# Patient Record
Sex: Female | Born: 1985
Health system: Southern US, Community
[De-identification: ages and names within clinical notes are randomized; demographics above are authoritative.]

## PROBLEM LIST (undated history)

## (undated) DIAGNOSIS — F319 Bipolar disorder, unspecified: Secondary | ICD-10-CM

## (undated) DIAGNOSIS — K219 Gastro-esophageal reflux disease without esophagitis: Secondary | ICD-10-CM

## (undated) DIAGNOSIS — F419 Anxiety disorder, unspecified: Secondary | ICD-10-CM

## (undated) DIAGNOSIS — Z8619 Personal history of other infectious and parasitic diseases: Secondary | ICD-10-CM

## (undated) HISTORY — DX: Personal history of other infectious and parasitic diseases: Z86.19

## (undated) HISTORY — DX: Bipolar disorder, unspecified: F31.9

## (undated) HISTORY — PX: TONSILLECTOMY: SUR1361

## (undated) HISTORY — DX: Gastro-esophageal reflux disease without esophagitis: K21.9

---

## 2009-07-01 ENCOUNTER — Emergency Department (HOSPITAL_COMMUNITY): Admission: EM | Admit: 2009-07-01 | Discharge: 2009-07-01 | Payer: Self-pay | Admitting: Emergency Medicine

## 2010-11-14 LAB — URINE MICROSCOPIC-ADD ON

## 2010-11-14 LAB — POCT PREGNANCY, URINE: Preg Test, Ur: NEGATIVE

## 2010-11-14 LAB — GC/CHLAMYDIA PROBE AMP, GENITAL
Chlamydia, DNA Probe: NEGATIVE
GC Probe Amp, Genital: NEGATIVE

## 2010-11-14 LAB — URINALYSIS, ROUTINE W REFLEX MICROSCOPIC
Bilirubin Urine: NEGATIVE
Glucose, UA: NEGATIVE mg/dL
Hgb urine dipstick: NEGATIVE
Ketones, ur: NEGATIVE mg/dL
Nitrite: NEGATIVE
Specific Gravity, Urine: 1.027 (ref 1.005–1.030)
pH: 6 (ref 5.0–8.0)

## 2010-11-14 LAB — RPR: RPR Ser Ql: NONREACTIVE

## 2010-11-14 LAB — WET PREP, GENITAL: Yeast Wet Prep HPF POC: NONE SEEN

## 2011-10-11 ENCOUNTER — Emergency Department (HOSPITAL_COMMUNITY)
Admission: EM | Admit: 2011-10-11 | Discharge: 2011-10-11 | Disposition: A | Discharge status: Home / Self Care | Payer: Medicaid Other | Attending: Emergency Medicine | Admitting: Emergency Medicine

## 2011-10-11 ENCOUNTER — Encounter (HOSPITAL_COMMUNITY): Payer: Self-pay | Admitting: Emergency Medicine

## 2011-10-11 DIAGNOSIS — R5381 Other malaise: Secondary | ICD-10-CM | POA: Insufficient documentation

## 2011-10-11 DIAGNOSIS — R6883 Chills (without fever): Secondary | ICD-10-CM | POA: Insufficient documentation

## 2011-10-11 DIAGNOSIS — R109 Unspecified abdominal pain: Secondary | ICD-10-CM | POA: Insufficient documentation

## 2011-10-11 DIAGNOSIS — R112 Nausea with vomiting, unspecified: Secondary | ICD-10-CM | POA: Insufficient documentation

## 2011-10-11 DIAGNOSIS — IMO0001 Reserved for inherently not codable concepts without codable children: Secondary | ICD-10-CM | POA: Insufficient documentation

## 2011-10-11 DIAGNOSIS — R51 Headache: Secondary | ICD-10-CM | POA: Insufficient documentation

## 2011-10-11 DIAGNOSIS — R630 Anorexia: Secondary | ICD-10-CM | POA: Insufficient documentation

## 2011-10-11 DIAGNOSIS — R197 Diarrhea, unspecified: Secondary | ICD-10-CM | POA: Insufficient documentation

## 2011-10-11 LAB — DIFFERENTIAL
Eosinophils Absolute: 0 10*3/uL (ref 0.0–0.7)
Lymphocytes Relative: 4 % — ABNORMAL LOW (ref 12–46)
Lymphs Abs: 0.4 10*3/uL — ABNORMAL LOW (ref 0.7–4.0)
Monocytes Relative: 6 % (ref 3–12)
Neutrophils Relative %: 91 % — ABNORMAL HIGH (ref 43–77)

## 2011-10-11 LAB — CBC
Hemoglobin: 14.2 g/dL (ref 12.0–15.0)
MCH: 31.5 pg (ref 26.0–34.0)
MCV: 90.9 fL (ref 78.0–100.0)
RBC: 4.51 MIL/uL (ref 3.87–5.11)
WBC: 9.8 10*3/uL (ref 4.0–10.5)

## 2011-10-11 LAB — COMPREHENSIVE METABOLIC PANEL
ALT: 12 U/L (ref 0–35)
Alkaline Phosphatase: 59 U/L (ref 39–117)
BUN: 12 mg/dL (ref 6–23)
CO2: 21 mEq/L (ref 19–32)
GFR calc Af Amer: >90 mL/min (ref >90)
GFR calc non Af Amer: >90 mL/min (ref >90)
Glucose, Bld: 123 mg/dL — ABNORMAL HIGH (ref 70–99)
Potassium: 3.8 mEq/L (ref 3.5–5.1)
Sodium: 138 mEq/L (ref 135–145)
Total Bilirubin: 0.5 mg/dL (ref 0.3–1.2)
Total Protein: 8 g/dL (ref 6.0–8.3)

## 2011-10-11 LAB — URINE MICROSCOPIC-ADD ON

## 2011-10-11 LAB — URINALYSIS, ROUTINE W REFLEX MICROSCOPIC
Ketones, ur: 15 mg/dL — AB
Leukocytes, UA: NEGATIVE

## 2011-10-11 LAB — LIPASE, BLOOD: Lipase: 11 U/L (ref 11–59)

## 2011-10-11 MED ORDER — MORPHINE SULFATE 4 MG/ML IJ SOLN
2.0000 mg | Freq: Once | INTRAMUSCULAR | Status: AC
  Administered 2011-10-11: 2 mg via INTRAVENOUS
  Filled 2011-10-11: qty 1

## 2011-10-11 MED ORDER — ONDANSETRON HCL 4 MG/2ML IJ SOLN
4.0000 mg | Freq: Once | INTRAMUSCULAR | Status: AC
  Administered 2011-10-11: 4 mg via INTRAVENOUS
  Filled 2011-10-11: qty 2

## 2011-10-11 MED ORDER — PROMETHAZINE HCL 25 MG PO TABS: 25.0000 mg | ORAL_TABLET | Freq: Four times a day (QID) | ORAL | Status: DC | PRN

## 2011-10-11 MED ORDER — SODIUM CHLORIDE 0.9 % IV BOLUS (SEPSIS)
1000.0000 mL | Freq: Once | INTRAVENOUS | Status: AC
  Administered 2011-10-11: 1000 mL via INTRAVENOUS

## 2011-10-11 MED ORDER — PROMETHAZINE HCL 25 MG/ML IJ SOLN
12.5000 mg | Freq: Once | INTRAMUSCULAR | Status: AC
  Administered 2011-10-11: 12.5 mg via INTRAVENOUS
  Filled 2011-10-11: qty 1

## 2011-10-11 NOTE — ED Provider Notes (Signed)
History     CSN: [id number]  Arrival date & time 10/11/11  4098   First MD Initiated Contact with Patient 10/11/11 0935      Chief Complaint  Patient presents with  . Nausea  . Emesis  . Generalized Body Aches    (Consider location/radiation/quality/duration/timing/severity/associated sxs/prior treatment) Patient is a 26 y.o. female presenting with vomiting. The history is provided by the patient.  Emesis  This is a new problem. The current episode started yesterday. The problem occurs more than 10 times per day. The problem has not changed since onset.The emesis has an appearance of stomach contents. There has been no fever. Associated symptoms include abdominal pain, chills, diarrhea, headaches and myalgias. Pertinent negatives include no fever.  Pt states she began vomiting yesterday after eating [employer]. States nausea, vomiting, diarrhea since. Denies taking any medications. States trying to drink water and gatorade but nothing is staying down. Daughter just got over a "stomach virus." Denies fever, but states having chills and body aches.   History reviewed. No pertinent past medical history.  Past Surgical History  Procedure Date  . Tonsillectomy     History reviewed. No pertinent family history.  History  Substance Use Topics  . Smoking status: Never Smoker   . Smokeless tobacco: Not on file  . Alcohol Use: Yes     drinks liquor regularly    OB History    Grav Para Term Preterm Abortions TAB SAB Ect Mult Living                  Review of Systems  Constitutional: Positive for chills. Negative for fever.  Eyes: Negative.   Respiratory: Negative.   Cardiovascular: Negative.   Gastrointestinal: Positive for nausea, vomiting, abdominal pain and diarrhea. Negative for blood in stool.  Genitourinary: Negative.   Musculoskeletal: Positive for myalgias.  Skin: Negative.   Neurological: Positive for weakness and headaches. Negative for dizziness.    Psychiatric/Behavioral: Negative.     Allergies  Aleve; Amoxicillin; and Ibuprofen  Home Medications  No current outpatient prescriptions on file.  BP 118/73  Pulse 103  Temp(Src) 98 F (36.7 C) (Oral)  Resp 20  Ht 5\' 7"  (1.702 m)  Wt 161 lb (73.029 kg)  BMI 25.22 kg/m2  SpO2 97%  LMP 10/04/2011  Physical Exam  Nursing note and vitals reviewed. Constitutional: She is oriented to person, place, and time. She appears well-developed and well-nourished.  HENT:  Head: Normocephalic.  Eyes: Conjunctivae are normal.  Neck: Neck supple.  Cardiovascular: Normal rate, regular rhythm and normal heart sounds.   Pulmonary/Chest: Effort normal and breath sounds normal. No respiratory distress.  Abdominal: Soft. Bowel sounds are normal. She exhibits no distension.       Diffuse tenderness. No guarding, no rebound tenderness  Musculoskeletal: Normal range of motion.  Neurological: She is alert and oriented to person, place, and time.  Skin: Skin is warm and dry. No pallor.  Psychiatric: She has a normal mood and affect.    ED Course  Procedures (including critical care time)  9:45 AM Pt seen and examined. N/V/D since yesterday. Daughter just got over the same. Unable to keep anything down. Abdomen soft, diffusely tender. Labs ordered, fluids ordered, nausea and pain meds ordered.\]  Results for orders placed during the hospital encounter of 10/11/11  URINALYSIS, ROUTINE W REFLEX MICROSCOPIC      Component Value Range   Color, Urine AMBER (*) YELLOW    APPearance CLEAR  CLEAR  Specific Gravity, Urine 1.036 (*) 1.005 - 1.030    pH 6.0  5.0 - 8.0    Glucose, UA NEGATIVE  NEGATIVE (mg/dL)   Hgb urine dipstick NEGATIVE  NEGATIVE    Bilirubin Urine SMALL (*) NEGATIVE    Ketones, ur 15 (*) NEGATIVE (mg/dL)   Protein, ur 30 (*) NEGATIVE (mg/dL)   Urobilinogen, UA 0.2  0.0 - 1.0 (mg/dL)   Nitrite NEGATIVE  NEGATIVE    Leukocytes, UA NEGATIVE  NEGATIVE   PREGNANCY, URINE       Component Value Range   Preg Test, Ur NEGATIVE  NEGATIVE   CBC      Component Value Range   WBC 9.8  4.0 - 10.5 (K/uL)   RBC 4.51  3.87 - 5.11 (MIL/uL)   Hemoglobin 14.2  12.0 - 15.0 (g/dL)   HCT 16.1  09.6 - 04.5 (%)   MCV 90.9  78.0 - 100.0 (fL)   MCH 31.5  26.0 - 34.0 (pg)   MCHC 34.6  30.0 - 36.0 (g/dL)   RDW 40.9  81.1 - 91.4 (%)   Platelets 227  150 - 400 (K/uL)  DIFFERENTIAL      Component Value Range   Neutrophils Relative 91 (*) 43 - 77 (%)   Neutro Abs 8.8 (*) 1.7 - 7.7 (K/uL)   Lymphocytes Relative 4 (*) 12 - 46 (%)   Lymphs Abs 0.4 (*) 0.7 - 4.0 (K/uL)   Monocytes Relative 6  3 - 12 (%)   Monocytes Absolute 0.6  0.1 - 1.0 (K/uL)   Eosinophils Relative 0  0 - 5 (%)   Eosinophils Absolute 0.0  0.0 - 0.7 (K/uL)   Basophils Relative 0  0 - 1 (%)   Basophils Absolute 0.0  0.0 - 0.1 (K/uL)  COMPREHENSIVE METABOLIC PANEL      Component Value Range   Sodium 138  135 - 145 (mEq/L)   Potassium 3.8  3.5 - 5.1 (mEq/L)   Chloride 104  96 - 112 (mEq/L)   CO2 21  19 - 32 (mEq/L)   Glucose, Bld 123 (*) 70 - 99 (mg/dL)   BUN 12  6 - 23 (mg/dL)   Creatinine, Ser 7.82  0.50 - 1.10 (mg/dL)   Calcium 9.5  8.4 - 95.6 (mg/dL)   Total Protein 8.0  6.0 - 8.3 (g/dL)   Albumin 3.9  3.5 - 5.2 (g/dL)   AST 16  0 - 37 (U/L)   ALT 12  0 - 35 (U/L)   Alkaline Phosphatase 59  39 - 117 (U/L)   Total Bilirubin 0.5  0.3 - 1.2 (mg/dL)   GFR calc non Af Amer >90  >90 (mL/min)   GFR calc Af Amer >90  >90 (mL/min)  LIPASE, BLOOD      Component Value Range   Lipase 11  11 - 59 (U/L)  URINE MICROSCOPIC-ADD ON      Component Value Range   Squamous Epithelial / LPF RARE  RARE    WBC, UA 0-2  <3 (WBC/hpf)   RBC / HPF 0-2  <3 (RBC/hpf)   Bacteria, UA RARE  RARE    Urine-Other MUCOUS PRESENT     No significant findings on labs. Pt's abdomen is soft, there is no guarding, no rebound tenderness. Urine pregn negative. Pt rehydrated. Antiemetics and pain medications given. No vomiting in ED. Feeling  better. VS normal. Tolerating fluids and saltine crackers. PT will be d/c home. Suspect viral gastroenteritis. Will follow up with pcp in  2-3 days if not improving. Instructed to return if worsening.   No diagnosis found.    MDM          Lottie Mussel, PA 10/11/11 1241

## 2011-10-11 NOTE — ED Notes (Signed)
Pt reports n/v with diarrhea and body aches since yesterday.  States daughter was also recently sick with same sx.

## 2011-10-11 NOTE — Discharge Instructions (Signed)
Continue to drink lots of fluids today. Rest. Take phenergan as prescribed for nausea. Advance foods as tolerate, see BRAT diet print out. Tylenol for body aches.  Follow up with your doctor in 2-3 days if not improving for recheck. Return if worsening.     Norovirus Infection Norovirus illness is caused by a viral infection. The term norovirus refers to a group of viruses. Any of those viruses can cause norovirus illness. This illness is often referred to by other names such as viral gastroenteritis, stomach flu, and food poisoning. Anyone can get a norovirus infection. People can have the illness multiple times during their lifetime. CAUSES  Norovirus is found in the stool or vomit of infected people. It is easily spread from person to person (contagious). People with norovirus are contagious from the moment they begin feeling ill. They may remain contagious for as long as 3 days to 2 weeks after recovery. People can become infected with the virus in several ways. This includes:  Eating food or drinking liquids that are contaminated with norovirus.   Touching surfaces or objects contaminated with norovirus, and then placing your hand in your mouth.   Having direct contact with a person who is infected and shows symptoms. This may occur while caring for someone with illness or while sharing foods or eating utensils with someone who is ill.  SYMPTOMS  Symptoms usually begin 1 to 2 days after ingestion of the virus. Symptoms may include:  Nausea.   Vomiting.   Diarrhea.   Stomach cramps.   Low-grade fever.   Chills.   Headache.   Muscle aches.   Tiredness.  Most people with norovirus illness get better within 1 to 2 days. Some people become dehydrated because they cannot drink enough liquids to replace those lost from vomiting and diarrhea. This is especially true for young children, the elderly, and others who are unable to care for themselves. DIAGNOSIS  Diagnosis is based on  your symptoms and exam. Currently, only state public health laboratories have the ability to test for norovirus in stool or vomit. TREATMENT  No specific treatment exists for norovirus infections. No vaccine is available to prevent infections. Norovirus illness is usually brief in healthy people. If you are ill with vomiting and diarrhea, you should drink enough water and fluids to keep your urine clear or pale yellow. Dehydration is the most serious health effect that can result from this infection. By drinking oral rehydration solution (ORS), people can reduce their chance of becoming dehydrated. There are many commercially available pre-made and powdered ORS designed to safely rehydrate people. These may be recommended by your caregiver. Replace any new fluid losses from diarrhea or vomiting with ORS as follows:  If your child weighs 10 kg or less (22 lb or less), give 60 to 120 ml ( to  cup or 2 to 4 oz) of ORS for each diarrheal stool or vomiting episode.   If your child weighs more than 10 kg (more than 22 lb), give 120 to 240 ml ( to 1 cup or 4 to 8 oz) of ORS for each diarrheal stool or vomiting episode.  HOME CARE INSTRUCTIONS   Follow all your caregiver's instructions.   Avoid sugar-free and alcoholic drinks while ill.   Only take over-the-counter or prescription medicines for pain, vomiting, diarrhea, or fever as directed by your caregiver.  You can decrease your chances of coming in contact with norovirus or spreading it by following these steps:  Frequently wash your  hands, especially after using the toilet, changing diapers, and before eating or preparing food.   Carefully wash fruits and vegetables. Cook shellfish before eating them.   Do not prepare food for others while you are infected and for at least 3 days after recovering from illness.   Thoroughly clean and disinfect contaminated surfaces immediately after an episode of illness using a bleach-based household cleaner.    Immediately remove and wash clothing or linens that may be contaminated with the virus.   Use the toilet to dispose of any vomit or stool. Make sure the surrounding area is kept clean.   Food that may have been contaminated by an ill person should be discarded.  SEEK IMMEDIATE MEDICAL CARE IF:   You develop symptoms of dehydration that do not improve with fluid replacement. This may include:   Excessive sleepiness.   Lack of tears.   Dry mouth.   Dizziness when standing.   Weak pulse.  Document Released: 10/19/2002 Document Revised: 04/10/2011 Document Reviewed: 11/20/2009 [facility] Patient Information 2012 [place], [place].  B.R.A.T. Diet Your doctor has recommended the B.R.A.T. diet for you or your child until the condition improves. This is often used to help control diarrhea and vomiting symptoms. If you or your child can tolerate clear liquids, you may have:  Bananas.   Rice.   Applesauce.   Toast (and other simple starches such as crackers, potatoes, noodles).  Be sure to avoid dairy products, meats, and fatty foods until symptoms are better. Fruit juices such as apple, grape, and prune juice can make diarrhea worse. Avoid these. Continue this diet for 2 days or as instructed by your caregiver. Document Released: 07/29/2005 Document Revised: 04/10/2011 Document Reviewed: 01/15/2007 [facility] Patient Information 2012 [place], [place].

## 2011-10-11 NOTE — ED Provider Notes (Signed)
Medical screening examination/treatment/procedure(s) were performed by non-physician practitioner and as supervising physician I was immediately available for consultation/collaboration.   Gerhard Munch, MD 10/11/11 [phone]

## 2011-12-09 ENCOUNTER — Inpatient Hospital Stay (HOSPITAL_COMMUNITY): Payer: Medicaid Other

## 2011-12-09 ENCOUNTER — Inpatient Hospital Stay (HOSPITAL_COMMUNITY)
Admission: AD | Admit: 2011-12-09 | Discharge: 2011-12-09 | Disposition: A | Discharge status: Home / Self Care | Payer: Medicaid Other | Source: Ambulatory Visit | Attending: Obstetrics & Gynecology | Admitting: Obstetrics & Gynecology

## 2011-12-09 ENCOUNTER — Encounter (HOSPITAL_COMMUNITY): Payer: Self-pay | Admitting: *Deleted

## 2011-12-09 DIAGNOSIS — Z975 Presence of (intrauterine) contraceptive device: Secondary | ICD-10-CM

## 2011-12-09 DIAGNOSIS — A499 Bacterial infection, unspecified: Secondary | ICD-10-CM

## 2011-12-09 DIAGNOSIS — N760 Acute vaginitis: Secondary | ICD-10-CM | POA: Insufficient documentation

## 2011-12-09 DIAGNOSIS — Z30431 Encounter for routine checking of intrauterine contraceptive device: Secondary | ICD-10-CM | POA: Insufficient documentation

## 2011-12-09 DIAGNOSIS — R109 Unspecified abdominal pain: Secondary | ICD-10-CM | POA: Insufficient documentation

## 2011-12-09 DIAGNOSIS — IMO0001 Reserved for inherently not codable concepts without codable children: Secondary | ICD-10-CM

## 2011-12-09 DIAGNOSIS — N938 Other specified abnormal uterine and vaginal bleeding: Secondary | ICD-10-CM | POA: Insufficient documentation

## 2011-12-09 DIAGNOSIS — N949 Unspecified condition associated with female genital organs and menstrual cycle: Secondary | ICD-10-CM | POA: Insufficient documentation

## 2011-12-09 DIAGNOSIS — B9689 Other specified bacterial agents as the cause of diseases classified elsewhere: Secondary | ICD-10-CM | POA: Insufficient documentation

## 2011-12-09 LAB — CBC
HCT: 40.8 % (ref 36.0–46.0)
Hemoglobin: 13.4 g/dL (ref 12.0–15.0)
RDW: 12.4 % (ref 11.5–15.5)
WBC: 7.1 10*3/uL (ref 4.0–10.5)

## 2011-12-09 LAB — DIFFERENTIAL
Basophils Absolute: 0 10*3/uL (ref 0.0–0.1)
Lymphocytes Relative: 28 % (ref 12–46)
Monocytes Absolute: 0.7 10*3/uL (ref 0.1–1.0)
Monocytes Relative: 10 % (ref 3–12)
Neutro Abs: 4.3 10*3/uL (ref 1.7–7.7)
Neutrophils Relative %: 60 % (ref 43–77)

## 2011-12-09 LAB — URINALYSIS, ROUTINE W REFLEX MICROSCOPIC
Glucose, UA: NEGATIVE mg/dL
Leukocytes, UA: NEGATIVE
Protein, ur: NEGATIVE mg/dL
Specific Gravity, Urine: >1.03 — ABNORMAL HIGH (ref 1.005–1.030)
pH: 5.5 (ref 5.0–8.0)

## 2011-12-09 LAB — POCT PREGNANCY, URINE: Preg Test, Ur: NEGATIVE

## 2011-12-09 LAB — URINE MICROSCOPIC-ADD ON

## 2011-12-09 LAB — WET PREP, GENITAL: Trich, Wet Prep: NONE SEEN

## 2011-12-09 MED ORDER — TRAMADOL HCL 50 MG PO TABS
50.0000 mg | ORAL_TABLET | Freq: Once | ORAL | Status: AC
  Administered 2011-12-09: 50 mg via ORAL
  Filled 2011-12-09: qty 1

## 2011-12-09 MED ORDER — METRONIDAZOLE 500 MG PO TABS: 500.0000 mg | ORAL_TABLET | Freq: Two times a day (BID) | ORAL | Status: AC

## 2011-12-09 NOTE — Discharge Instructions (Signed)
Bacterial Vaginosis Bacterial vaginosis (BV) is a vaginal infection where the normal balance of bacteria in the vagina is disrupted. The normal balance is then replaced by an overgrowth of certain bacteria. There are several different kinds of bacteria that can cause BV. BV is the most common vaginal infection in women of childbearing age. CAUSES   The cause of BV is not fully understood. BV develops when there is an increase or imbalance of harmful bacteria.   Some activities or behaviors can upset the normal balance of bacteria in the vagina and put women at increased risk including:   Having a new sex partner or multiple sex partners.   Douching.   Using an intrauterine device (IUD) for contraception.   It is not clear what role sexual activity plays in the development of BV. However, women that have never had sexual intercourse are rarely infected with BV.  Women do not get BV from toilet seats, bedding, swimming pools or from touching objects around them.  SYMPTOMS   Grey vaginal discharge.   A fish-like odor with discharge, especially after sexual intercourse.   Itching or burning of the vagina and vulva.   Burning or pain with urination.   Some women have no signs or symptoms at all.  DIAGNOSIS  Your caregiver must examine the vagina for signs of BV. Your caregiver will perform lab tests and look at the sample of vaginal fluid through a microscope. They will look for bacteria and abnormal cells (clue cells), a pH test higher than 4.5, and a positive amine test all associated with BV.  RISKS AND COMPLICATIONS   Pelvic inflammatory disease (PID).   Infections following gynecology surgery.   Developing HIV.   Developing herpes virus.  TREATMENT  Sometimes BV will clear up without treatment. However, all women with symptoms of BV should be treated to avoid complications, especially if gynecology surgery is planned. Female partners generally do not need to be treated. However,  BV may spread between female sex partners so treatment is helpful in preventing a recurrence of BV.   BV may be treated with antibiotics. The antibiotics come in either pill or vaginal cream forms. Either can be used with nonpregnant or pregnant women, but the recommended dosages differ. These antibiotics are not harmful to the baby.   BV can recur after treatment. If this happens, a second round of antibiotics will often be prescribed.   Treatment is important for pregnant women. If not treated, BV can cause a premature delivery, especially for a pregnant woman who had a premature birth in the past. All pregnant women who have symptoms of BV should be checked and treated.   For chronic reoccurrence of BV, treatment with a type of prescribed gel vaginally twice a week is helpful.  HOME CARE INSTRUCTIONS   Finish all medication as directed by your caregiver.   Do not have sex until treatment is completed.   Tell your sexual partner that you have a vaginal infection. They should see their caregiver and be treated if they have problems, such as a mild rash or itching.   Practice safe sex. Use condoms. Only have 1 sex partner.  PREVENTION  Basic prevention steps can help reduce the risk of upsetting the natural balance of bacteria in the vagina and developing BV:  Do not have sexual intercourse (be abstinent).   Do not douche.   Use all of the medicine prescribed for treatment of BV, even if the signs and symptoms go away.     Tell your sex partner if you have BV. That way, they can be treated, if needed, to prevent reoccurrence.  SEEK MEDICAL CARE IF:   Your symptoms are not improving after 3 days of treatment.   You have increased discharge, pain, or fever.  MAKE SURE YOU:   Understand these instructions.   Will watch your condition.   Will get help right away if you are not doing well or get worse.  FOR MORE INFORMATION  Division of STD Prevention (DSTDP), Centers for Disease  Control and Prevention: [employer]Apps.[employer]za American [employer] Health Association (ASHA): www.ashastd.org  Document Released: 07/29/2005 Document Revised: 07/18/2011 Document Reviewed: 01/19/2009 [facility] Patient Information 2012 [place], [place].Intrauterine Device Information An intrauterine device (IUD) is inserted into your uterus and prevents pregnancy. There are 2 types of IUDs available:  Copper IUD. This type of IUD is wrapped in copper wire and is placed inside the uterus. Copper makes the uterus and fallopian tubes produce a fluid that kills sperm. The copper IUD can stay in place for 10 years.   Hormone IUD. This type of IUD contains the hormone progestin (synthetic progesterone). The hormone thickens the cervical mucus and prevents sperm from entering the uterus, and it also thins the uterine lining to prevent implantation of a fertilized egg. The hormone can weaken or kill the sperm that get into the uterus. The hormone IUD can stay in place for 5 years.  Your caregiver will make sure you are a good candidate for a contraceptive IUD. Discuss with your caregiver the possible side effects. ADVANTAGES  It is highly effective, reversible, long-acting, and low maintenance.   There are no estrogen-related side effects.   An IUD can be used when breastfeeding.   It is not associated with weight gain.   It works immediately after insertion.   The copper IUD does not interfere with your female hormones.   The progesterone IUD can make heavy menstrual periods lighter.   The progesterone IUD can be used for 5 years.   The copper IUD can be used for 10 years.  DISADVANTAGES  The progesterone IUD can be associated with irregular bleeding patterns.   The copper IUD can make your menstrual flow heavier and more painful.   You may experience cramping and vaginal bleeding after insertion.  Document Released: 07/02/2004 Document Revised: 07/18/2011 Document Reviewed: 12/01/2010 [facility]  Patient Information 2012 [place], [place].

## 2011-12-09 NOTE — MAU Provider Note (Signed)
History     CSN: [id number]  Arrival date and time: 12/09/11 1528   First Provider Initiated Contact with Patient 12/09/11 1657      Chief Complaint  Patient presents with  . Abdominal Cramping   HPI Latoya Guerrero is 26 y.o. G2P1011 [facility]  [facility]  Continues with spotting and pain.  Blood has an odor. Took ASA without relief.  Called the [facility] today and instructed to come here for evaluation.  Cannot take ibuprofen gives her hives.  Tylenol gives her headaches.   1 sexual partner X 4 years.  Patient states she has taken Vicodin in the past without side effects.      History reviewed. No pertinent past medical history.  Past Surgical History  Procedure Date  . Tonsillectomy   . Vaginal delivery     History reviewed. No pertinent family history.  History  Substance Use Topics  . Smoking status: Never Smoker   . Smokeless tobacco: Never Used  . Alcohol Use: Yes     Occas shots of liquor    Allergies:  Allergies  Allergen Reactions  . Amoxicillin Rash  . Ibuprofen Rash  . Naproxen Sodium Rash    Prescriptions prior to admission  Medication Sig Dispense Refill  . fluticasone (FLONASE) 50 MCG/ACT nasal spray Place 1 spray into the nose every morning.      . promethazine (PHENERGAN) 25 MG tablet Take 1 tablet (25 mg total) by mouth every 6 (six) hours as needed for nausea.  16 tablet  0    Review of Systems  Constitutional: Negative for fever and chills.  Gastrointestinal: Positive for abdominal pain (cramping).  Genitourinary:       + vaginal bleeding   Physical Exam   Blood pressure 120/71, pulse 81, temperature 98.2 F (36.8 C), temperature source Oral, resp. rate 16, height 5' 6.5" (1.689 m), weight 75.569 kg (166 lb 9.6 oz), last menstrual period 11/12/2011, SpO2 99.00%.  Physical Exam  Constitutional: She is oriented to person, place, and time. She appears well-developed  and well-nourished. She appears distressed (uncomfortable).  GI: Soft. There is tenderness. There is no rebound and no guarding.  Genitourinary: Uterus is enlarged. Uterus is not tender. Cervix exhibits no motion tenderness, no discharge and no friability. Right adnexum displays tenderness. Right adnexum displays no mass and no fullness. Left adnexum displays tenderness. Left adnexum displays no mass and no fullness. There is bleeding (small amount of bleeding without clot.  IUD string seen appropriate length) around the vagina.  Neurological: She is alert and oriented to person, place, and time.  Skin: Skin is warm and dry.  Psychiatric: She has a normal mood and affect. Her behavior is normal.   Clinical Data: Pain and bleeding, IUD placed on 11/14/2011  TRANSABDOMINAL AND TRANSVAGINAL ULTRASOUND OF PELVIS  Technique: Both transabdominal and transvaginal ultrasound  examinations of the pelvis were performed. Transabdominal technique  was performed for global imaging of the pelvis including uterus,  ovaries, adnexal regions, and pelvic cul-de-sac.  Comparison: None  It was necessary to proceed with endovaginal exam following the  transabdominal exam to visualize the uterus, endometrium, and  ovaries.  Findings:  Uterus: Normal size with slight retroflexion, 7.6 cm length by 4.7  cm AP by 6.2 cm transverse. No focal uterine mass.  Endometrium: 6 mm thick. An echogenic focus is identified at the  endometrial complex with associated acoustic shadowing, consistent  representing an IUD within the endometrial canal at the upper to  mid uterine segments.  Right ovary: Normal size and morphology, 3.9 x 1.9 x 1.6 cm.  Left ovary: Normal size and morphology, 3.3 x 2.0 x 1.7 cm  Other findings: Small amount of nonspecific free pelvic fluid in  cul-de-sac. No adnexal masses.  IMPRESSION:  IUD is identified within the endometrial canal at the mid to upper  uterine segments.  Small amount of  nonspecific free pelvic fluid.  Original Report Authenticated By: Lollie Marrow, M.D.    Results for orders placed during the hospital encounter of 12/09/11 (from the past 24 hour(s))  URINALYSIS, ROUTINE W REFLEX MICROSCOPIC     Status: Abnormal   Collection Time   12/09/11  4:00 PM      Component Value Range   Color, Urine YELLOW  YELLOW    APPearance CLEAR  CLEAR    Specific Gravity, Urine >1.030 (*) 1.005 - 1.030    pH 5.5  5.0 - 8.0    Glucose, UA NEGATIVE  NEGATIVE (mg/dL)   Hgb urine dipstick LARGE (*) NEGATIVE    Bilirubin Urine NEGATIVE  NEGATIVE    Ketones, ur NEGATIVE  NEGATIVE (mg/dL)   Protein, ur NEGATIVE  NEGATIVE (mg/dL)   Urobilinogen, UA 0.2  0.0 - 1.0 (mg/dL)   Nitrite NEGATIVE  NEGATIVE    Leukocytes, UA NEGATIVE  NEGATIVE   URINE MICROSCOPIC-ADD ON     Status: Abnormal   Collection Time   12/09/11  4:00 PM      Component Value Range   Squamous Epithelial / LPF FEW (*) RARE    WBC, UA 0-2  <3 (WBC/hpf)   Bacteria, UA FEW (*) RARE    Urine-Other MUCOUS PRESENT    POCT PREGNANCY, URINE     Status: Normal   Collection Time   12/09/11  4:18 PM      Component Value Range   Preg Test, Ur NEGATIVE  NEGATIVE   CBC     Status: Normal   Collection Time   12/09/11  5:10 PM      Component Value Range   WBC 7.1  4.0 - 10.5 (K/uL)   RBC 4.31  3.87 - 5.11 (MIL/uL)   Hemoglobin 13.4  12.0 - 15.0 (g/dL)   HCT 16.1  09.6 - 04.5 (%)   MCV 94.7  78.0 - 100.0 (fL)   MCH 31.1  26.0 - 34.0 (pg)   MCHC 32.8  30.0 - 36.0 (g/dL)   RDW 40.9  81.1 - 91.4 (%)   Platelets 220  150 - 400 (K/uL)  DIFFERENTIAL     Status: Normal   Collection Time   12/09/11  5:10 PM      Component Value Range   Neutrophils Relative 60  43 - 77 (%)   Neutro Abs 4.3  1.7 - 7.7 (K/uL)   Lymphocytes Relative 28  12 - 46 (%)   Lymphs Abs 2.0  0.7 - 4.0 (K/uL)   Monocytes Relative 10  3 - 12 (%)   Monocytes Absolute 0.7  0.1 - 1.0 (K/uL)   Eosinophils Relative 2  0 - 5 (%)   Eosinophils Absolute 0.1   0.0 - 0.7 (K/uL)   Basophils Relative 0  0 - 1 (%)   Basophils Absolute 0.0  0.0 - 0.1 (K/uL)  WET PREP, GENITAL     Status: Abnormal   Collection Time   12/09/11  5:13 PM      Component  Value Range   Yeast Wet Prep HPF POC NONE SEEN  NONE SEEN    Trich, Wet Prep NONE SEEN  NONE SEEN    Clue Cells Wet Prep HPF POC FEW (*) NONE SEEN    WBC, Wet Prep HPF POC FEW (*) NONE SEEN     MAU Course  Procedures Discussed pain medication options with Sue Lush in pharmacy.  Can order Percocet without the tylenol or given ultram.   Ordered Ultram 50mg  po for pain. GC/CHL culture to lab  MDM  Discussed lab and ultrasound results with the patient.  States she is feeling better after eating crackers and Ultram  Assessment and Plan  A: Abdominal pain and abnormal vaginal bleeding    IUD    Bacterial vaginosis  P: Rx for Flagyl  Follow up with the [facility] department if sxs continue         Dekota Shenk,EVE M 12/09/2011, 5:00 PM
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
## 2011-12-10 NOTE — MAU Provider Note (Signed)
Medical Screening exam and patient care preformed by advanced practice provider.  Agree with the above management.  

## 2012-10-23 ENCOUNTER — Emergency Department: Payer: Self-pay | Admitting: Emergency Medicine

## 2012-10-23 LAB — CBC
MCH: 31.1 pg (ref 26.0–34.0)
MCV: 94 fL (ref 80–100)
RBC: 4.35 10*6/uL (ref 3.80–5.20)
RDW: 11.9 % (ref 11.5–14.5)

## 2012-10-23 LAB — COMPREHENSIVE METABOLIC PANEL
Alkaline Phosphatase: 88 U/L (ref 50–136)
Anion Gap: 7 (ref 7–16)
BUN: 10 mg/dL (ref 7–18)
Bilirubin,Total: 0.8 mg/dL (ref 0.2–1.0)
Creatinine: 0.81 mg/dL (ref 0.60–1.30)
EGFR (African American): >60
Potassium: 3.6 mmol/L (ref 3.5–5.1)
SGOT(AST): 17 U/L (ref 15–37)
SGPT (ALT): 22 U/L (ref 12–78)

## 2012-10-23 LAB — URINALYSIS, COMPLETE
Bilirubin,UR: NEGATIVE
Glucose,UR: NEGATIVE mg/dL (ref 0–75)
Leukocyte Esterase: NEGATIVE
Nitrite: NEGATIVE
Ph: 5 (ref 4.5–8.0)
Specific Gravity: 1.027 (ref 1.003–1.030)

## 2014-06-05 ENCOUNTER — Emergency Department (HOSPITAL_COMMUNITY)
Admission: EM | Admit: 2014-06-05 | Discharge: 2014-06-05 | Disposition: A | Discharge status: Home / Self Care | Payer: No Typology Code available for payment source | Attending: Emergency Medicine | Admitting: Emergency Medicine

## 2014-06-05 ENCOUNTER — Encounter (HOSPITAL_COMMUNITY): Payer: Self-pay | Admitting: Emergency Medicine

## 2014-06-05 DIAGNOSIS — S161XXA Strain of muscle, fascia and tendon at neck level, initial encounter: Secondary | ICD-10-CM | POA: Insufficient documentation

## 2014-06-05 DIAGNOSIS — Z880 Allergy status to penicillin: Secondary | ICD-10-CM | POA: Insufficient documentation

## 2014-06-05 DIAGNOSIS — Z7951 Long term (current) use of inhaled steroids: Secondary | ICD-10-CM | POA: Insufficient documentation

## 2014-06-05 DIAGNOSIS — Y9389 Activity, other specified: Secondary | ICD-10-CM | POA: Insufficient documentation

## 2014-06-05 DIAGNOSIS — S29012A Strain of muscle and tendon of back wall of thorax, initial encounter: Secondary | ICD-10-CM | POA: Diagnosis not present

## 2014-06-05 DIAGNOSIS — T148XXA Other injury of unspecified body region, initial encounter: Secondary | ICD-10-CM

## 2014-06-05 DIAGNOSIS — Y92410 Unspecified street and highway as the place of occurrence of the external cause: Secondary | ICD-10-CM | POA: Diagnosis not present

## 2014-06-05 DIAGNOSIS — S299XXA Unspecified injury of thorax, initial encounter: Secondary | ICD-10-CM | POA: Diagnosis present

## 2014-06-05 MED ORDER — METHOCARBAMOL 500 MG PO TABS
500.0000 mg | ORAL_TABLET | Freq: Once | ORAL | Status: AC
  Administered 2014-06-05: 500 mg via ORAL
  Filled 2014-06-05: qty 1

## 2014-06-05 MED ORDER — ACETAMINOPHEN 325 MG PO TABS
650.0000 mg | ORAL_TABLET | Freq: Once | ORAL | Status: AC
  Administered 2014-06-05: 650 mg via ORAL
  Filled 2014-06-05: qty 2

## 2014-06-05 MED ORDER — METHOCARBAMOL 500 MG PO TABS: 1000.0000 mg | ORAL_TABLET | Freq: Four times a day (QID) | ORAL | Status: DC
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
## 2014-06-05 NOTE — ED Provider Notes (Signed)
Medical screening examination/treatment/procedure(s) were performed by non-physician practitioner and as supervising physician I was immediately available for consultation/collaboration.   EKG Interpretation None        Que Meneely, MD 06/05/14 1658 
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
## 2014-06-07 ENCOUNTER — Encounter (HOSPITAL_COMMUNITY): Payer: Self-pay | Admitting: Emergency Medicine

## 2014-06-07 ENCOUNTER — Emergency Department (INDEPENDENT_AMBULATORY_CARE_PROVIDER_SITE_OTHER)
Admission: EM | Admit: 2014-06-07 | Discharge: 2014-06-07 | Disposition: A | Discharge status: Home / Self Care | Payer: Medicaid Other | Source: Home / Self Care | Attending: Family Medicine | Admitting: Family Medicine

## 2014-06-07 DIAGNOSIS — M542 Cervicalgia: Secondary | ICD-10-CM

## 2014-06-07 DIAGNOSIS — M549 Dorsalgia, unspecified: Secondary | ICD-10-CM

## 2014-06-07 MED ORDER — METAXALONE 800 MG PO TABS: 800.0000 mg | ORAL_TABLET | Freq: Three times a day (TID) | ORAL | Status: DC

## 2014-06-07 NOTE — Discharge Instructions (Signed)
Heat, stretch and medicine as needed. Stay active, see your doctor as needed.
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
## 2014-06-08 ENCOUNTER — Encounter (HOSPITAL_COMMUNITY): Payer: Self-pay | Admitting: Emergency Medicine

## 2014-06-08 ENCOUNTER — Emergency Department (HOSPITAL_COMMUNITY): Payer: No Typology Code available for payment source

## 2014-06-08 ENCOUNTER — Emergency Department (HOSPITAL_COMMUNITY)
Admission: EM | Admit: 2014-06-08 | Discharge: 2014-06-08 | Disposition: A | Discharge status: Home / Self Care | Payer: No Typology Code available for payment source | Attending: Emergency Medicine | Admitting: Emergency Medicine

## 2014-06-08 DIAGNOSIS — M542 Cervicalgia: Secondary | ICD-10-CM

## 2014-06-08 DIAGNOSIS — Y92410 Unspecified street and highway as the place of occurrence of the external cause: Secondary | ICD-10-CM | POA: Diagnosis not present

## 2014-06-08 DIAGNOSIS — Z79899 Other long term (current) drug therapy: Secondary | ICD-10-CM | POA: Diagnosis not present

## 2014-06-08 DIAGNOSIS — Y9389 Activity, other specified: Secondary | ICD-10-CM | POA: Insufficient documentation

## 2014-06-08 DIAGNOSIS — S3992XA Unspecified injury of lower back, initial encounter: Secondary | ICD-10-CM | POA: Insufficient documentation

## 2014-06-08 DIAGNOSIS — Z7951 Long term (current) use of inhaled steroids: Secondary | ICD-10-CM | POA: Diagnosis not present

## 2014-06-08 DIAGNOSIS — S199XXA Unspecified injury of neck, initial encounter: Secondary | ICD-10-CM | POA: Insufficient documentation

## 2014-06-08 DIAGNOSIS — Z880 Allergy status to penicillin: Secondary | ICD-10-CM | POA: Diagnosis not present

## 2014-06-08 DIAGNOSIS — M545 Low back pain, unspecified: Secondary | ICD-10-CM

## 2014-06-08 MED ORDER — HYDROCODONE-ACETAMINOPHEN 5-325 MG PO TABS: 1.0000 | ORAL_TABLET | Freq: Four times a day (QID) | ORAL | Status: DC | PRN
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
## 2014-06-08 NOTE — Discharge Instructions (Signed)
1. Medications: vicodin, usual home medications 2. Treatment: rest, drink plenty of fluids, decrease your Robaxin dose to 0.5-1 tablet 3. Follow Up: Please followup with your primary doctor in 3 days for discussion of your diagnoses and further evaluation after today's visit; if you do not have a primary care doctor use the resource guide provided to find one; Please return to the ER for difficulty walking, loss of bowel or bladder control or other concerning symptoms  The name of the chiropractor you asked for is: Jearld ShinesLarry W. [place]rosman, [place] - Chiropractor Address: [address],  [place], [place] [address] [phone]   Back Exercises Back exercises help treat and prevent back injuries. The goal of back exercises is to increase the strength of your abdominal and back muscles and the flexibility of your back. These exercises should be started when you no longer have back pain. Back exercises include:  Pelvic Tilt. Lie on your back with your knees bent. Tilt your pelvis until the lower part of your back is against the floor. Hold this position 5 to 10 sec and repeat 5 to 10 times.  Knee to Chest. Pull first 1 knee up against your chest and hold for 20 to 30 seconds, repeat this with the other knee, and then both knees. This may be done with the other leg straight or bent, whichever feels better.  Sit-Ups or Curl-Ups. Bend your knees 90 degrees. Start with tilting your pelvis, and do a partial, slow sit-up, lifting your trunk only 30 to 45 degrees off the floor. Take at least 2 to 3 seconds for each sit-up. Do not do sit-ups with your knees out straight. If partial sit-ups are difficult, simply do the above but with only tightening your abdominal muscles and holding it as directed.  Hip-Lift. Lie on your back with your knees flexed 90 degrees. Push down with your feet and shoulders as you raise your hips a couple inches off the floor; hold for 10 seconds, repeat 5 to 10 times.  Back arches.  Lie on your stomach, propping yourself up on bent elbows. Slowly press on your hands, causing an arch in your low back. Repeat 3 to 5 times. Any initial stiffness and discomfort should lessen with repetition over time.  Shoulder-Lifts. Lie face down with arms beside your body. Keep hips and torso pressed to floor as you slowly lift your head and shoulders off the floor. Do not overdo your exercises, especially in the beginning. Exercises may cause you some mild back discomfort which lasts for a few minutes; however, if the pain is more severe, or lasts for more than 15 minutes, do not continue exercises until you see your caregiver. Improvement with exercise therapy for back problems is slow.  See your caregivers for assistance with developing a proper back exercise program. Document Released: 09/05/2004 Document Revised: 10/21/2011 Document Reviewed: 05/30/2011 [facility] Patient Information 2015 [employer]. This information is not intended to replace advice given to you by your health care provider. Make sure you discuss any questions you have with your health care provider.

## 2014-06-10 NOTE — ED Provider Notes (Signed)
Medical screening examination/treatment/procedure(s) were performed by non-physician practitioner and as supervising physician I was immediately available for consultation/collaboration.    Dede Dobesh, MD 06/10/14 0020 

## 2014-06-13 ENCOUNTER — Encounter (HOSPITAL_COMMUNITY): Payer: Self-pay | Admitting: Emergency Medicine

## 2014-06-19 ENCOUNTER — Encounter (HOSPITAL_COMMUNITY): Payer: Self-pay | Admitting: Emergency Medicine

## 2014-06-19 ENCOUNTER — Emergency Department (HOSPITAL_COMMUNITY)
Admission: EM | Admit: 2014-06-19 | Discharge: 2014-06-19 | Disposition: A | Discharge status: Home / Self Care | Payer: Self-pay | Source: Home / Self Care | Attending: Emergency Medicine | Admitting: Emergency Medicine

## 2014-06-19 DIAGNOSIS — N390 Urinary tract infection, site not specified: Secondary | ICD-10-CM

## 2014-06-19 LAB — POCT URINALYSIS DIP (DEVICE)
BILIRUBIN URINE: NEGATIVE
Glucose, UA: NEGATIVE mg/dL
KETONES UR: NEGATIVE mg/dL
Nitrite: NEGATIVE
PH: 7 (ref 5.0–8.0)
Protein, ur: 100 mg/dL — AB
SPECIFIC GRAVITY, URINE: 1.025 (ref 1.005–1.030)
Urobilinogen, UA: 1 mg/dL (ref 0.0–1.0)

## 2014-06-19 LAB — POCT PREGNANCY, URINE: PREG TEST UR: NEGATIVE

## 2014-06-19 MED ORDER — KETOROLAC TROMETHAMINE 60 MG/2ML IM SOLN
60.0000 mg | Freq: Once | INTRAMUSCULAR | Status: AC
  Administered 2014-06-19: 60 mg via INTRAMUSCULAR

## 2014-06-19 MED ORDER — PHENAZOPYRIDINE HCL 200 MG PO TABS
200.0000 mg | ORAL_TABLET | Freq: Three times a day (TID) | ORAL | Status: DC
Start: 2014-06-19 — End: 2014-10-19

## 2014-06-19 MED ORDER — CEPHALEXIN 500 MG PO CAPS: 500.0000 mg | ORAL_CAPSULE | Freq: Four times a day (QID) | ORAL | Status: DC

## 2014-06-19 MED ORDER — KETOROLAC TROMETHAMINE 60 MG/2ML IM SOLN
INTRAMUSCULAR | Status: AC
  Filled 2014-06-19: qty 2

## 2014-06-19 NOTE — ED Provider Notes (Addendum)
CSN: [id number]     Arrival date & time 06/19/14  1642 History   First MD Initiated Contact with Patient 06/19/14 1648     Chief Complaint  Patient presents with  . Urinary Tract Infection   (Consider location/radiation/quality/duration/timing/severity/associated sxs/prior Treatment) HPI She is a 28 year old woman here for evaluation of dysuria. Her symptoms started yesterday with some low back and suprapubic pain. This afternoon, she developed burning with urination and hematuria. She also reports frequency and urgency. No fevers or chills. No nausea or vomiting. No flank pain.  History reviewed. No pertinent past medical history. Past Surgical History  Procedure Laterality Date  . Tonsillectomy    . Vaginal delivery     History reviewed. No pertinent family history. History  Substance Use Topics  . Smoking status: Never Smoker   . Smokeless tobacco: Never Used  . Alcohol Use: Yes     Comment: Occas shots of liquor   OB History    Gravida Para Term Preterm AB TAB SAB Ectopic Multiple Living   2 1 1  1 1   0 0 1     Review of Systems  Constitutional: Negative for fever and chills.  Gastrointestinal: Negative for nausea and vomiting.  Genitourinary: Positive for dysuria, urgency, frequency and hematuria. Negative for flank pain.  Musculoskeletal: Positive for back pain.    Allergies  Amoxicillin; Ibuprofen; and Naproxen sodium  Home Medications   Prior to Admission medications   Medication Sig Start Date End Date Taking? Authorizing Provider  cephALEXin (KEFLEX) 500 MG capsule Take 1 capsule (500 mg total) by mouth 4 (four) times daily. 06/19/14   Charm RingsErin J Breeze Berringer, MD  fluticasone (FLONASE) 50 MCG/ACT nasal spray Place 1 spray into the nose every morning.    Historical Provider, MD  HYDROcodone-acetaminophen (NORCO/VICODIN) 5-325 MG per tablet Take 1 tablet by mouth every 6 (six) hours as needed for moderate pain or severe pain. 06/08/14   Hannah Muthersbaugh, PA-C  metaxalone  (SKELAXIN) 800 MG tablet Take 1 tablet (800 mg total) by mouth 3 (three) times daily. as muscle relaxer 06/07/14   Linna HoffJames D Kindl, MD  methocarbamol (ROBAXIN) 500 MG tablet Take 2 tablets (1,000 mg total) by mouth 4 (four) times daily. 06/05/14   Renne CriglerJoshua Geiple, PA-C  phenazopyridine (PYRIDIUM) 200 MG tablet Take 1 tablet (200 mg total) by mouth 3 (three) times daily. 06/19/14   Charm RingsErin J Daron Stutz, MD   BP 113/76 mmHg  Pulse 81  Temp(Src) 97.3 F (36.3 C) (Oral)  Resp 18  SpO2 96%  LMP 06/02/2014 Physical Exam  Constitutional: She is oriented to person, place, and time. She appears well-developed and well-nourished. She appears distressed.  Unable to perform complete exam as she was unable to lie down.  Pulmonary/Chest: Effort normal.  Abdominal:  No CVA tenderness  Neurological: She is alert and oriented to person, place, and time.    ED Course  Procedures (including critical care time) Labs Review Labs Reviewed  POCT URINALYSIS DIP (DEVICE) - Abnormal; Notable for the following:    Hgb urine dipstick MODERATE (*)    Protein, ur 100 (*)    Leukocytes, UA SMALL (*)    All other components within normal limits  URINE CULTURE  POCT PREGNANCY, URINE    Imaging Review No results found.   MDM   1. UTI (lower urinary tract infection)    UA concerning for urinary tract infection. Urine sent for culture. Keflex 4 times a day for 3 days. Pyridium provided to use as  needed. Reviewed warning signs to return as in after visit summary.  Patient given toradol 60mg  IM to help with pain prior to discharge.  Charm RingsErin J Cope Marte, MD 06/19/14 1744  Charm RingsErin J Azarie Coriz, MD 06/22/14 [phone]

## 2014-06-19 NOTE — ED Notes (Signed)
C/o  Lower back pain.  Urinary urgency and frequency.   Hematuria.  On set today but symptoms worsened around 1 p.m today.    No otc meds tried.

## 2014-06-19 NOTE — Discharge Instructions (Signed)
Take Keflex 1 pill 4 times a day for 3 days. Use the pyridium 3 times a day as needed for pain with urination.  If you develop fevers, vomiting, flank pain, or are not improving in 48 hours, please go to the emergency room.

## 2014-06-21 LAB — URINE CULTURE

## 2014-06-24 NOTE — ED Notes (Signed)
Urine culture: >100,000 colonies E. Coli.  Pt. adequately treated with Keflex. Vassie MoselleYork, Mileydi Milsap M 06/24/2014

## 2014-10-19 ENCOUNTER — Encounter (HOSPITAL_COMMUNITY): Payer: Self-pay | Admitting: Family Medicine

## 2014-10-19 ENCOUNTER — Emergency Department (INDEPENDENT_AMBULATORY_CARE_PROVIDER_SITE_OTHER)
Admission: EM | Admit: 2014-10-19 | Discharge: 2014-10-19 | Disposition: A | Discharge status: Home / Self Care | Payer: Self-pay | Source: Home / Self Care | Attending: Family Medicine | Admitting: Family Medicine

## 2014-10-19 DIAGNOSIS — J040 Acute laryngitis: Secondary | ICD-10-CM

## 2014-10-19 DIAGNOSIS — J0140 Acute pansinusitis, unspecified: Secondary | ICD-10-CM

## 2014-10-19 MED ORDER — PREDNISONE 20 MG PO TABS
40.0000 mg | ORAL_TABLET | Freq: Every day | ORAL | Status: DC
Start: 2014-10-19 — End: 2015-03-02

## 2014-10-19 MED ORDER — AZITHROMYCIN 250 MG PO TABS
250.0000 mg | ORAL_TABLET | Freq: Every day | ORAL | Status: DC
Start: 2014-10-19 — End: 2015-03-02

## 2014-10-19 MED ORDER — FLUTICASONE PROPIONATE 50 MCG/ACT NA SUSP: 2.0000 | Freq: Every day | NASAL | Status: DC

## 2014-10-19 MED ORDER — IPRATROPIUM BROMIDE 0.06 % NA SOLN: 2.0000 | Freq: Four times a day (QID) | NASAL | Status: DC

## 2014-10-19 MED ORDER — FLUCONAZOLE 150 MG PO TABS: 150.0000 mg | ORAL_TABLET | Freq: Every day | ORAL | Status: DC

## 2014-10-19 NOTE — ED Provider Notes (Signed)
CSN: [id number]     Arrival date & time 10/19/14  1149 History   First MD Initiated Contact with Patient 10/19/14 1312     Chief Complaint  Patient presents with  . URI   (Consider location/radiation/quality/duration/timing/severity/associated sxs/prior Treatment) HPI   7 days ago developed sore throat. Worse at night. Yellow phlegm. Associated w/ subjective fevers, runny nose, HA. Cough is getting worse. Lost voice this morning. Alka seltzer and salt water gargles w/o much benefit. Sore throat not as bad as at onset.    History reviewed. No pertinent past medical history. Past Surgical History  Procedure Laterality Date  . Tonsillectomy    . Vaginal delivery     Family History  Problem Relation Age of Onset  . Hypertension Mother    History  Substance Use Topics  . Smoking status: Never Smoker   . Smokeless tobacco: Never Used  . Alcohol Use: Yes     Comment: Occas shots of liquor   OB History    Gravida Para Term Preterm AB TAB SAB Ectopic Multiple Living   2 1 1  1 1   0 0 1     Review of Systems Per HPI with all other pertinent systems negative.   Allergies  Amoxicillin; Ibuprofen; and Naproxen sodium  Home Medications   Prior to Admission medications   Medication Sig Start Date End Date Taking? Authorizing Provider  azithromycin (ZITHROMAX) 250 MG tablet Take 1 tablet (250 mg total) by mouth daily. Take first 2 tablets together, then 1 every day until finished. 10/19/14   Ozella Rocksavid J Merrell, MD  fluconazole (DIFLUCAN) 150 MG tablet Take 1 tablet (150 mg total) by mouth daily. Repeat dose in 3 days 10/19/14   Ozella Rocksavid J Merrell, MD  fluticasone [facility]FLONASE) 50 MCG/ACT nasal spray Place 2 sprays into both nostrils at bedtime. 10/19/14   Ozella Rocksavid J Merrell, MD  ipratropium (ATROVENT) 0.06 % nasal spray Place 2 sprays into both nostrils 4 (four) times daily. 10/19/14   Ozella Rocksavid J Merrell, MD  predniSONE (DELTASONE) 20 MG tablet Take 2 tablets (40 mg total) by mouth daily with breakfast. Take  daily with breakfast 10/19/14   Ozella Rocksavid J Merrell, MD   BP 109/73 mmHg  Pulse 73  Temp(Src) 98.4 F (36.9 C) (Oral)  Resp 18  SpO2 97%  LMP 09/28/2014 Physical Exam  Constitutional: She is oriented to person, place, and time. She appears well-developed and well-nourished.  HENT:  Mild frontal and maxillary sinuses ttp.  Minimal pharyngeal cobblestoning. Tonsils absent.  Eyes: EOM are normal. Pupils are equal, round, and reactive to light.  Neck: Normal range of motion. Neck supple.  Cardiovascular: Normal rate, normal heart sounds and intact distal pulses.   No murmur heard. Pulmonary/Chest: Effort normal and breath sounds normal. No stridor. No respiratory distress. She has no wheezes. She has no rales. She exhibits no tenderness.  Abdominal: Soft.  Musculoskeletal: Normal range of motion. She exhibits no edema or tenderness.  Lymphadenopathy:    She has no cervical adenopathy.  Neurological: She is alert and oriented to person, place, and time.  Skin: Skin is warm.  Psychiatric: She has a normal mood and affect. Her behavior is normal. Judgment and thought content normal.    ED Course  Procedures (including critical care time) Labs Review Labs Reviewed - No data to display  Imaging Review No results found.   MDM   1. Acute pansinusitis, recurrence not specified   2. Laryngitis    Prednisone for laryngitis. Start nasal  Atrovent, Flonase, daily allergy pill, NSAIDs for relief of sinus infection. However specific ongoing for more than 7 days if not relieved another 24-48 hours and fevers persist or continues to worsen patient will start antibiotics. Diflucan given if patient develops yeast infection. Precautions given and all questions answered  Shelly Flatten, MD Family Medicine 10/19/2014, 1:42 PM       Ozella Rocks, MD 10/19/14 [phone]

## 2014-10-19 NOTE — ED Notes (Signed)
C/o cold sx onset Thursday night Sx include laryngitis, ST, BA, hot flashes Denies fevers, chills Alert, no signs of acute distress.

## 2014-10-19 NOTE — Discharge Instructions (Signed)
You are developing a sinus infection. This may resolve with proper medications. Please are nasal sprays as prescribed. Please also start taking a daily allergy pill. Please use the steroid for the raspy irritated throat as this is a sign of laryngitis. Please only start the azithromycin if your symptoms do not improve within the next 24-48 hours. Please use the Diflucan if you develop a yeast infection.

## 2015-02-14 ENCOUNTER — Encounter: Payer: Self-pay | Admitting: Obstetrics & Gynecology

## 2015-02-14 ENCOUNTER — Encounter (HOSPITAL_COMMUNITY): Payer: Self-pay | Admitting: *Deleted

## 2015-02-16 ENCOUNTER — Encounter (HOSPITAL_COMMUNITY): Payer: Self-pay

## 2015-02-16 ENCOUNTER — Ambulatory Visit (HOSPITAL_COMMUNITY)
Admission: RE | Admit: 2015-02-16 | Discharge: 2015-02-16 | Disposition: A | Discharge status: Home / Self Care | Payer: No Typology Code available for payment source | Source: Ambulatory Visit | Attending: Obstetrics and Gynecology | Admitting: Obstetrics and Gynecology

## 2015-02-16 VITALS — BP 100/62 | Temp 98.9°F | Ht 67.0 in | Wt 170.0 lb

## 2015-02-16 DIAGNOSIS — R87610 Atypical squamous cells of undetermined significance on cytologic smear of cervix (ASC-US): Secondary | ICD-10-CM

## 2015-02-16 DIAGNOSIS — R87810 Cervical high risk human papillomavirus (HPV) DNA test positive: Secondary | ICD-10-CM

## 2015-02-16 DIAGNOSIS — Z1239 Encounter for other screening for malignant neoplasm of breast: Secondary | ICD-10-CM

## 2015-02-16 NOTE — Progress Notes (Signed)
CLINIC:  [facility]  REASON FOR VISIT: Well-woman exam   HISTORY OF PRESENT ILLNESS:  Ms. Elisabeth MostStevenson is a 29 y.o. female [facility] exam. No family history of breast cancer. She has no complaints today.  She had an abnormal pap on 12/14/14 that showed ASC-US, HPV (+).  She has history of abnormal pap smears in 2009 and again in 2010. She is scheduled for colposcopy on 03/02/15.  REVIEW OF SYSTEMS:  Denies any breast pain, nodularity, nipple inversion, or nipple discharge bilaterally.   ALLERGIES: Allergies  Allergen Reactions  . Amoxicillin Hives and Rash  . Ibuprofen Hives and Rash  . Naproxen Sodium Hives and Rash     CURRENT MEDICATIONS:  Current Outpatient Prescriptions on File Prior to Encounter  Medication Sig Dispense Refill  . fluticasone (FLONASE) 50 MCG/ACT nasal spray Place 2 sprays into both nostrils at bedtime. 16 g 0  . azithromycin (ZITHROMAX) 250 MG tablet Take 1 tablet (250 mg total) by mouth daily. Take first 2 tablets together, then 1 every day until finished. (Patient not taking: Reported on 02/16/2015) 6 tablet 0  . fluconazole (DIFLUCAN) 150 MG tablet Take 1 tablet (150 mg total) by mouth daily. Repeat dose in 3 days (Patient not taking: Reported on 02/16/2015) 2 tablet 0  . ipratropium (ATROVENT) 0.06 % nasal spray Place 2 sprays into both nostrils 4 (four) times daily. (Patient not taking: Reported on 02/16/2015) 15 mL 12  . predniSONE (DELTASONE) 20 MG tablet Take 2 tablets (40 mg total) by mouth daily with breakfast. Take daily with breakfast (Patient not taking: Reported on 02/16/2015) 8 tablet 0   No current facility-administered medications on file prior to encounter.     PHYSICAL EXAM:  Vitals:  Filed Vitals:   02/16/15 1126  BP: 100/62  Temp: 98.9 F (37.2 C)   General: Well-nourished, well-appearing female in no acute distress.  She is unaccompanied in clinic  today.  Stoney BangSabrina Holland, LPN was present during physical exam for this patient.  Breasts: Bilateral breasts exposed and observed with patient standing (arms at side, arms on hips, arms on hips flexed forward, and arms over head).  No gross abnormalities including breast skin puckering or dimpling noted on observation.  Breasts symmetrical without evidence of skin redness, thickening, or peau d'orange appearance. Bilateral nipple piercings present. No nipple retraction or nipple discharge noted bilaterally.  No breast nodularity palpated in bilateral breasts.  Normal fibrocystic breast changes noted in bilateral breasts. Axillary lymph nodes: No axillary lymphadenopathy bilaterally.   GU: Exam deferred. Pap smear is up-to-date.  Colposcopy pending.  ASSESSMENT & PLAN:   1. Breast cancer screening: Ms. Elisabeth MostStevenson has no palpable breast abnormalities on her clinical breast exam today.  She was given instructions and educational materials regarding breast self-awareness. Ms. Elisabeth MostStevenson is aware of this plan and agrees with it.   2. Cervical cancer screening: Ms.Temkin will have her [facility] as [facility]. She was provided education about what to expect and common side effects of colposcopy.  She will [facility] results of the [facility] and was encouraged to contact them if she had not received notification of results within 2 weeks after the procedure is completed. She verbalized understanding.   Ms. Elisabeth MostStevenson was encouraged to ask questions and all questions were answered to her satisfaction.    Lubertha BasqueGretchen Iasia Forcier, NP [facility]  [phone] 

## 2015-03-02 ENCOUNTER — Other Ambulatory Visit (HOSPITAL_COMMUNITY)
Admission: RE | Admit: 2015-03-02 | Discharge: 2015-03-02 | Disposition: A | Discharge status: Home / Self Care | Payer: Self-pay | Source: Ambulatory Visit | Attending: Obstetrics & Gynecology | Admitting: Obstetrics & Gynecology

## 2015-03-02 ENCOUNTER — Ambulatory Visit (INDEPENDENT_AMBULATORY_CARE_PROVIDER_SITE_OTHER): Payer: Self-pay | Admitting: Obstetrics & Gynecology

## 2015-03-02 ENCOUNTER — Encounter: Payer: Self-pay | Admitting: Obstetrics & Gynecology

## 2015-03-02 VITALS — BP 126/79 | HR 91 | Temp 98.4°F | Ht 67.0 in | Wt 170.8 lb

## 2015-03-02 DIAGNOSIS — Z3202 Encounter for pregnancy test, result negative: Secondary | ICD-10-CM

## 2015-03-02 DIAGNOSIS — R87810 Cervical high risk human papillomavirus (HPV) DNA test positive: Secondary | ICD-10-CM

## 2015-03-02 DIAGNOSIS — R87619 Unspecified abnormal cytological findings in specimens from cervix uteri: Secondary | ICD-10-CM | POA: Insufficient documentation

## 2015-03-02 DIAGNOSIS — R87610 Atypical squamous cells of undetermined significance on cytologic smear of cervix (ASC-US): Secondary | ICD-10-CM

## 2015-03-02 LAB — POCT PREGNANCY, URINE
PREG TEST UR: NEGATIVE
Preg Test, Ur: NEGATIVE

## 2015-03-14 ENCOUNTER — Telehealth: Payer: Self-pay | Admitting: General Practice

## 2015-03-15 ENCOUNTER — Encounter: Payer: Self-pay | Admitting: Obstetrics & Gynecology

## 2015-03-15 ENCOUNTER — Ambulatory Visit (INDEPENDENT_AMBULATORY_CARE_PROVIDER_SITE_OTHER): Payer: Self-pay | Admitting: Obstetrics & Gynecology

## 2015-03-15 VITALS — BP 121/69 | HR 93 | Temp 98.4°F | Resp 20 | Ht 67.0 in | Wt 173.5 lb

## 2015-03-15 DIAGNOSIS — N871 Moderate cervical dysplasia: Secondary | ICD-10-CM

## 2015-03-15 DIAGNOSIS — Z3202 Encounter for pregnancy test, result negative: Secondary | ICD-10-CM

## 2015-03-15 LAB — POCT PREGNANCY, URINE: Preg Test, Ur: NEGATIVE

## 2015-03-15 NOTE — Progress Notes (Addendum)
   Subjective:    Patient ID: Latoya Guerrero, female    DOB: Aug 09, 1986, 29 y.o.   MRN: [id number]  HPI This 29 yo S AA is here for a colpo.   Review of Systems     Objective:   Physical Exam  UPT negative, consent signed, time out done Cervix prepped with acetic acid. Transformation zone seen in its entirety. Colpo adequate. Changes c/w HGSIL seen in a circumferencial fashion at the os (punctation) Biopsy done ECC obtained. She tolerated the procedure well.        Assessment & Plan:  Probable HGSIL- rec cryo if her pathology is consistent with colpo findings

## 2015-03-15 NOTE — Progress Notes (Signed)
   Subjective:    Patient ID: Latoya Guerrero, female    DOB: August 10, 1986, 29 y.o.   MRN: [id number]  HPI  29 yo S AA woman is here for cryo. Her colpo showed CIN 1&2 and her pathology showed CIN 2 with a negative ECC.  Review of Systems     Objective:   Physical Exam  Her cryo was done for 3 minutes after applying vinegar. She tolerated the procedure well.      Assessment & Plan:  CIN2- now s/p cryo RTC for pap in a year

## 2015-04-03 NOTE — Telephone Encounter (Signed)
Opened in error

## 2015-07-05 ENCOUNTER — Other Ambulatory Visit: Payer: Self-pay | Admitting: Physician Assistant

## 2015-07-05 ENCOUNTER — Other Ambulatory Visit (HOSPITAL_COMMUNITY)
Admission: RE | Admit: 2015-07-05 | Discharge: 2015-07-05 | Disposition: A | Discharge status: Home / Self Care | Payer: 59 | Source: Ambulatory Visit | Attending: Obstetrics & Gynecology | Admitting: Obstetrics & Gynecology

## 2015-07-05 ENCOUNTER — Other Ambulatory Visit: Payer: Self-pay | Admitting: Obstetrics & Gynecology

## 2015-07-05 DIAGNOSIS — Z1151 Encounter for screening for human papillomavirus (HPV): Secondary | ICD-10-CM | POA: Insufficient documentation

## 2015-07-05 DIAGNOSIS — Z01411 Encounter for gynecological examination (general) (routine) with abnormal findings: Secondary | ICD-10-CM | POA: Insufficient documentation

## 2015-07-05 DIAGNOSIS — K219 Gastro-esophageal reflux disease without esophagitis: Secondary | ICD-10-CM

## 2015-07-05 DIAGNOSIS — R87810 Cervical high risk human papillomavirus (HPV) DNA test positive: Secondary | ICD-10-CM | POA: Insufficient documentation

## 2015-07-10 ENCOUNTER — Inpatient Hospital Stay: Admission: RE | Admit: 2015-07-10 | Payer: Self-pay | Source: Ambulatory Visit

## 2015-07-11 LAB — CYTOLOGY - PAP

## 2015-07-13 ENCOUNTER — Ambulatory Visit
Admission: RE | Admit: 2015-07-13 | Discharge: 2015-07-13 | Disposition: A | Discharge status: Home / Self Care | Payer: 59 | Source: Ambulatory Visit | Attending: Physician Assistant | Admitting: Physician Assistant

## 2015-07-13 DIAGNOSIS — K219 Gastro-esophageal reflux disease without esophagitis: Secondary | ICD-10-CM

## 2015-07-18 ENCOUNTER — Other Ambulatory Visit: Payer: Self-pay | Admitting: Family Medicine

## 2015-07-18 DIAGNOSIS — E010 Iodine-deficiency related diffuse (endemic) goiter: Secondary | ICD-10-CM

## 2015-08-02 ENCOUNTER — Other Ambulatory Visit: Payer: Self-pay | Admitting: Obstetrics & Gynecology

## 2015-08-04 ENCOUNTER — Ambulatory Visit
Admission: RE | Admit: 2015-08-04 | Discharge: 2015-08-04 | Disposition: A | Discharge status: Home / Self Care | Payer: 59 | Source: Ambulatory Visit | Attending: Family Medicine | Admitting: Family Medicine

## 2015-08-04 DIAGNOSIS — E010 Iodine-deficiency related diffuse (endemic) goiter: Secondary | ICD-10-CM

## 2016-02-28 ENCOUNTER — Encounter (HOSPITAL_COMMUNITY): Payer: Self-pay | Admitting: *Deleted

## 2016-04-05 ENCOUNTER — Encounter (HOSPITAL_COMMUNITY): Payer: Self-pay | Admitting: Emergency Medicine

## 2016-04-05 ENCOUNTER — Ambulatory Visit (INDEPENDENT_AMBULATORY_CARE_PROVIDER_SITE_OTHER): Payer: Worker's Compensation

## 2016-04-05 ENCOUNTER — Ambulatory Visit (HOSPITAL_COMMUNITY)
Admission: EM | Admit: 2016-04-05 | Discharge: 2016-04-05 | Disposition: A | Discharge status: Home / Self Care | Payer: Worker's Compensation | Attending: Radiology | Admitting: Radiology

## 2016-04-05 DIAGNOSIS — Z111 Encounter for screening for respiratory tuberculosis: Secondary | ICD-10-CM | POA: Diagnosis not present

## 2016-04-05 NOTE — ED Triage Notes (Signed)
Pt reports pos TB today at work and would like chest x-ray.... She is asymptomatic.   A&O x4... NAD

## 2016-04-05 NOTE — ED Provider Notes (Signed)
CSN: [id number]     Arrival date & time 04/05/16  1745 History   First MD Initiated Contact with Patient 04/05/16 1815     Chief Complaint  Patient presents with  . PPD Reading   (Consider location/radiation/quality/duration/timing/severity/associated sxs/prior Treatment) Patient presents with induration X 2 mm to left forearm. Patient is otherwise healthy. No fevers, cough or known contacts with TB. Patient does work in the school system and states "that I have been traveling a lot"      History reviewed. No pertinent past medical history. Past Surgical History:  Procedure Laterality Date  . TONSILLECTOMY    . VAGINAL DELIVERY     Family History  Problem Relation Age of Onset  . Hypertension Mother   . Cancer Mother     skin   Social History  Substance Use Topics  . Smoking status: Never Smoker  . Smokeless tobacco: Never Used  . Alcohol use Yes     Comment: Occas shots of liquor   OB History    Gravida Para Term Preterm AB Living   3 1 1   2 1    SAB TAB Ectopic Multiple Live Births     2 0 0       Review of Systems  Constitutional: Negative.   HENT: Negative.   Respiratory: Negative.     Allergies  Amoxicillin; Ibuprofen; and Naproxen sodium  Home Medications   Prior to Admission medications   Medication Sig Start Date End Date Taking? Authorizing Provider  fluticasone (FLONASE) 50 MCG/ACT nasal spray Place 2 sprays into both nostrils at bedtime. 10/19/14   Ozella Rocksavid J Merrell, MD  Levonorgestrel-Ethinyl Estradiol (SEASONIQUE) 0.15-0.03 &0.01 MG tablet Take 1 tablet by mouth daily.    Historical Provider, MD   Meds Ordered and Administered this Visit  Medications - No data to display  BP 129/79 (BP Location: Left Arm)   Temp 99 F (37.2 C) (Oral)   Resp 18   SpO2 100%  No data found.   Physical Exam  Constitutional: She appears well-developed and well-nourished.  Cardiovascular: Normal rate and regular rhythm.   Pulmonary/Chest: Effort normal and  breath sounds normal.  Skin: Skin is warm and dry.  erythema 5 cm with a 2 cm area of induration to left arm.     Urgent Care Course   Clinical Course    Procedures (including critical care time)  Labs Review Labs Reviewed - No data to display  Imaging Review No results found.   Visual Acuity Review  Right Eye Distance:   Left Eye Distance:   Bilateral Distance:    Right Eye Near:   Left Eye Near:    Bilateral Near:         MDM  No diagnosis found.    Alene MiresJennifer C Kaimen Peine, NP 04/05/16 1851

## 2016-04-05 NOTE — ED Notes (Signed)
D/c by Victorino DikeJennifer, NP

## 2016-09-20 ENCOUNTER — Encounter (HOSPITAL_COMMUNITY): Payer: Self-pay | Admitting: Emergency Medicine

## 2016-09-20 ENCOUNTER — Ambulatory Visit (HOSPITAL_COMMUNITY)
Admission: EM | Admit: 2016-09-20 | Discharge: 2016-09-20 | Disposition: A | Discharge status: Home / Self Care | Payer: BLUE CROSS/BLUE SHIELD | Attending: Family Medicine | Admitting: Family Medicine

## 2016-09-20 DIAGNOSIS — B9789 Other viral agents as the cause of diseases classified elsewhere: Secondary | ICD-10-CM

## 2016-09-20 DIAGNOSIS — R6889 Other general symptoms and signs: Secondary | ICD-10-CM

## 2016-09-20 DIAGNOSIS — J069 Acute upper respiratory infection, unspecified: Secondary | ICD-10-CM

## 2016-09-20 DIAGNOSIS — J101 Influenza due to other identified influenza virus with other respiratory manifestations: Secondary | ICD-10-CM | POA: Diagnosis not present

## 2016-09-20 MED ORDER — OSELTAMIVIR PHOSPHATE 75 MG PO CAPS: 75.0000 mg | ORAL_CAPSULE | Freq: Two times a day (BID) | ORAL | 0 refills | Status: DC

## 2016-09-20 NOTE — ED Triage Notes (Signed)
Pt has been suffering from nasal congestion, cough, headache, body aches and dizziness since yesterday.  Pt states her daughter had tested positive for the flu last week.

## 2016-09-20 NOTE — ED Provider Notes (Signed)
CSN: [id number]     Arrival date & time 09/20/16  1640 History   First MD Initiated Contact with Patient 09/20/16 1650     Chief Complaint  Patient presents with  . URI   (Consider location/radiation/quality/duration/timing/severity/associated sxs/prior Treatment) 31 year old female presents with a group of symptoms of body aches, headache, fever of 99, sore throat, earache, dizziness. She is awake alert, talkative almost loquacious, smiling at times. Does not appear acutely ill. She states symptoms started yesterday. She did not have a flu shot issue because she does not believe in them. She is demanding test to tell whether she has a flu or not and wants no exactly what type of infection that she may have.      History reviewed. No pertinent past medical history. Past Surgical History:  Procedure Laterality Date  . TONSILLECTOMY    . VAGINAL DELIVERY     Family History  Problem Relation Age of Onset  . Hypertension Mother   . Cancer Mother     skin   Social History  Substance Use Topics  . Smoking status: Never Smoker  . Smokeless tobacco: Never Used  . Alcohol use Yes     Comment: Occas shots of liquor   OB History    Gravida Para Term Preterm AB Living   3 1 1   2 1    SAB TAB Ectopic Multiple Live Births     2 0 0       Review of Systems  Constitutional: Positive for activity change, fatigue and fever. Negative for appetite change and chills.  HENT: Positive for congestion, postnasal drip, rhinorrhea and sore throat. Negative for facial swelling.   Eyes: Negative.   Respiratory: Negative.  Negative for shortness of breath.   Cardiovascular: Negative.   Gastrointestinal: Negative.   Genitourinary: Negative.   Musculoskeletal: Negative for neck pain and neck stiffness.  Skin: Negative.  Negative for pallor and rash.  Neurological: Negative.     Allergies  Amoxicillin; Ibuprofen; and Naproxen sodium  Home Medications   Prior to Admission medications    Medication Sig Start Date End Date Taking? Authorizing Provider  fluticasone (FLONASE) 50 MCG/ACT nasal spray Place 2 sprays into both nostrils at bedtime. 10/19/14   Ozella Rocks, MD  Levonorgestrel-Ethinyl Estradiol (SEASONIQUE) 0.15-0.03 &0.01 MG tablet Take 1 tablet by mouth daily.    Historical Provider, MD  oseltamivir (TAMIFLU) 75 MG capsule Take 1 capsule (75 mg total) by mouth 2 (two) times daily. X 5 days 09/20/16   Hayden Rasmussen, NP   Meds Ordered and Administered this Visit  Medications - No data to display  BP 117/80 (BP Location: Left Arm)   Pulse 97   Temp 99 F (37.2 C) (Oral)   LMP 09/05/2016 (Exact Date)   SpO2 96%  No data found.   Physical Exam  Constitutional: She is oriented to person, place, and time. She appears well-developed and well-nourished. No distress.  HENT:  Head: Normocephalic.  Right Ear: External ear normal.  Left Ear: External ear normal.  Mouth/Throat: No oropharyngeal exudate.  Bilateral TMs are mildly retracted otherwise normal. Oropharynx with minor injection and clear PND. No swelling, exudates. Airway widely patent.  Eyes: EOM are normal. Pupils are equal, round, and reactive to light.  Neck: Normal range of motion. Neck supple.  Cardiovascular: Normal rate, regular rhythm, normal heart sounds and intact distal pulses.   Pulmonary/Chest: Effort normal and breath sounds normal. No respiratory distress. She has no wheezes. She has no  rales.  Musculoskeletal: Normal range of motion. She exhibits no edema.  Lymphadenopathy:    She has no cervical adenopathy.  Neurological: She is alert and oriented to person, place, and time.  Skin: Skin is warm and dry.  Nursing note and vitals reviewed.   Urgent Care Course     Procedures (including critical care time)  Labs Review Labs Reviewed - No data to display  Imaging Review No results found.   Visual Acuity Review  Right Eye Distance:   Left Eye Distance:   Bilateral Distance:     Right Eye Near:   Left Eye Near:    Bilateral Near:         MDM   1. Flu-like symptoms   2. Viral upper respiratory tract infection    The primary treatment for influenza and upper respiratory infection is to stay home, rest, drink clear fluids and stay well-hydrated. Below were listed medications that may help with your symptoms. Sudafed PE 10 mg every 4 to 6 hours as needed for congestion Allegra or Zyrtec daily as needed for drainage and runny nose. For stronger antihistamine may take Chlor-Trimeton 2 to 4 mg every 4 to 6 hours, may cause drowsiness. Saline nasal spray used frequently. Ibuprofen 600 mg every 6 hours as needed for pain, discomfort or fever. Drink plenty of fluids and stay well-hydrated.    The patient was not happy that we could not perform a flu test were give her an exact diagnosis of a specific virus or other agent that would cause her to be sick at this time. It was explained to her that there are several viruses that are causing similar symptoms and that she may have the flu but it is a relatively milder case. She has low-grade fever of 99. She states she is going to find a doctor that can perform a flu test on her. She also thinks that she may have sinusitis however her complaint is primarily stuffy nose that developed yesterday. Doubt bacterial sinusitis. She is also only taking DayQuil and no other medications for her symptoms. Hayden Rasmussenavid Shemekia Patane, NP 09/20/16 1711    Hayden Rasmussenavid Domanik [place], NP 09/20/16 [phone]

## 2016-09-20 NOTE — Discharge Instructions (Signed)
The primary treatment for influenza and upper respiratory infection is to stay home, rest, drink clear fluids and stay well-hydrated. Below were listed medications that may help with your symptoms. Sudafed PE 10 mg every 4 to 6 hours as needed for congestion Allegra or Zyrtec daily as needed for drainage and runny nose. For stronger antihistamine may take Chlor-Trimeton 2 to 4 mg every 4 to 6 hours, may cause drowsiness. Saline nasal spray used frequently. Ibuprofen 600 mg every 6 hours as needed for pain, discomfort or fever. Drink plenty of fluids and stay well-hydrated.

## 2016-09-25 DIAGNOSIS — M654 Radial styloid tenosynovitis [de Quervain]: Secondary | ICD-10-CM | POA: Diagnosis not present

## 2016-10-04 ENCOUNTER — Other Ambulatory Visit (HOSPITAL_COMMUNITY)
Admission: RE | Admit: 2016-10-04 | Discharge: 2016-10-04 | Disposition: A | Discharge status: Home / Self Care | Payer: BLUE CROSS/BLUE SHIELD | Source: Ambulatory Visit | Attending: Obstetrics and Gynecology | Admitting: Obstetrics and Gynecology

## 2016-10-04 ENCOUNTER — Other Ambulatory Visit: Payer: Self-pay | Admitting: Obstetrics & Gynecology

## 2016-10-04 DIAGNOSIS — Z01419 Encounter for gynecological examination (general) (routine) without abnormal findings: Secondary | ICD-10-CM | POA: Diagnosis not present

## 2016-10-04 DIAGNOSIS — Z01411 Encounter for gynecological examination (general) (routine) with abnormal findings: Secondary | ICD-10-CM | POA: Insufficient documentation

## 2016-10-09 LAB — CYTOLOGY - PAP
ADEQUACY: ABSENT
DIAGNOSIS: NEGATIVE

## 2016-11-01 DIAGNOSIS — L659 Nonscarring hair loss, unspecified: Secondary | ICD-10-CM | POA: Diagnosis not present

## 2016-11-01 DIAGNOSIS — R1013 Epigastric pain: Secondary | ICD-10-CM | POA: Diagnosis not present

## 2016-11-01 DIAGNOSIS — L658 Other specified nonscarring hair loss: Secondary | ICD-10-CM | POA: Diagnosis not present

## 2016-11-01 DIAGNOSIS — K219 Gastro-esophageal reflux disease without esophagitis: Secondary | ICD-10-CM | POA: Diagnosis not present

## 2016-11-01 DIAGNOSIS — R196 Halitosis: Secondary | ICD-10-CM | POA: Diagnosis not present

## 2016-12-06 DIAGNOSIS — R196 Halitosis: Secondary | ICD-10-CM | POA: Diagnosis not present

## 2016-12-06 DIAGNOSIS — K219 Gastro-esophageal reflux disease without esophagitis: Secondary | ICD-10-CM | POA: Diagnosis not present

## 2016-12-06 DIAGNOSIS — F411 Generalized anxiety disorder: Secondary | ICD-10-CM | POA: Diagnosis not present

## 2016-12-16 DIAGNOSIS — M654 Radial styloid tenosynovitis [de Quervain]: Secondary | ICD-10-CM | POA: Diagnosis not present

## 2016-12-16 DIAGNOSIS — R200 Anesthesia of skin: Secondary | ICD-10-CM | POA: Diagnosis not present

## 2016-12-23 DIAGNOSIS — R12 Heartburn: Secondary | ICD-10-CM | POA: Diagnosis not present

## 2016-12-23 DIAGNOSIS — K219 Gastro-esophageal reflux disease without esophagitis: Secondary | ICD-10-CM | POA: Diagnosis not present

## 2017-01-03 DIAGNOSIS — F411 Generalized anxiety disorder: Secondary | ICD-10-CM | POA: Diagnosis not present

## 2017-09-22 ENCOUNTER — Other Ambulatory Visit: Payer: Self-pay | Admitting: Physician Assistant

## 2017-10-30 ENCOUNTER — Encounter (HOSPITAL_COMMUNITY)
Admission: RE | Disposition: A | Discharge status: Home / Self Care | Payer: Self-pay | Source: Ambulatory Visit | Attending: Gastroenterology

## 2017-10-30 ENCOUNTER — Ambulatory Visit (HOSPITAL_COMMUNITY)
Admission: RE | Admit: 2017-10-30 | Discharge: 2017-10-30 | Disposition: A | Discharge status: Home / Self Care | Payer: BC Managed Care – PPO | Source: Ambulatory Visit | Attending: Gastroenterology | Admitting: Gastroenterology

## 2017-10-30 DIAGNOSIS — K219 Gastro-esophageal reflux disease without esophagitis: Secondary | ICD-10-CM | POA: Diagnosis present

## 2017-10-30 DIAGNOSIS — R196 Halitosis: Secondary | ICD-10-CM | POA: Insufficient documentation

## 2017-10-30 HISTORY — PX: ESOPHAGEAL MANOMETRY: SHX5429

## 2017-10-30 HISTORY — PX: PH IMPEDANCE STUDY: SHX5565

## 2017-10-30 SURGERY — MANOMETRY, ESOPHAGUS

## 2017-10-30 MED ORDER — LIDOCAINE VISCOUS 2 % MT SOLN
OROMUCOSAL | Status: AC
  Filled 2017-10-30: qty 15

## 2017-10-30 SURGICAL SUPPLY — 2 items
FACESHIELD LNG OPTICON STERILE (SAFETY) IMPLANT
GLOVE BIO SURGEON STRL SZ8 (GLOVE) ×4 IMPLANT

## 2017-10-30 NOTE — Progress Notes (Signed)
Esophageal Manometry done per protocol . Pt tolerated well. Ph probe place per protocol at 36.5 cm. Teaching done with pt regarding study and monitor using teach back. Pt verbalized understanding.  Pt to return to endo unit to have probe removed  At 1530 tomorrow 10/31/2017.

## 2017-10-31 ENCOUNTER — Encounter (HOSPITAL_COMMUNITY): Payer: Self-pay | Admitting: Gastroenterology

## 2018-02-09 ENCOUNTER — Other Ambulatory Visit: Payer: Self-pay | Admitting: Physician Assistant

## 2018-02-09 ENCOUNTER — Ambulatory Visit
Admission: RE | Admit: 2018-02-09 | Discharge: 2018-02-09 | Disposition: A | Discharge status: Home / Self Care | Payer: BC Managed Care – PPO | Source: Ambulatory Visit | Attending: Physician Assistant | Admitting: Physician Assistant

## 2018-02-09 DIAGNOSIS — R7611 Nonspecific reaction to tuberculin skin test without active tuberculosis: Secondary | ICD-10-CM

## 2018-05-20 ENCOUNTER — Ambulatory Visit (HOSPITAL_COMMUNITY)
Admission: EM | Admit: 2018-05-20 | Discharge: 2018-05-20 | Disposition: A | Discharge status: Home / Self Care | Payer: BLUE CROSS/BLUE SHIELD | Source: Home / Self Care | Attending: Family Medicine | Admitting: Family Medicine

## 2018-05-20 ENCOUNTER — Encounter (HOSPITAL_COMMUNITY): Payer: Self-pay | Admitting: Emergency Medicine

## 2018-05-20 ENCOUNTER — Emergency Department (EMERGENCY_DEPARTMENT_HOSPITAL)
Admission: EM | Admit: 2018-05-20 | Discharge: 2018-05-21 | Disposition: A | Discharge status: Home / Self Care | Payer: BLUE CROSS/BLUE SHIELD | Source: Home / Self Care | Attending: Emergency Medicine | Admitting: Emergency Medicine

## 2018-05-20 ENCOUNTER — Emergency Department (HOSPITAL_COMMUNITY)
Admission: EM | Admit: 2018-05-20 | Discharge: 2018-05-20 | Disposition: A | Discharge status: Home / Self Care | Payer: BLUE CROSS/BLUE SHIELD | Attending: Emergency Medicine | Admitting: Emergency Medicine

## 2018-05-20 ENCOUNTER — Encounter (HOSPITAL_COMMUNITY): Payer: Self-pay | Admitting: *Deleted

## 2018-05-20 DIAGNOSIS — F419 Anxiety disorder, unspecified: Secondary | ICD-10-CM | POA: Diagnosis not present

## 2018-05-20 DIAGNOSIS — Z5321 Procedure and treatment not carried out due to patient leaving prior to being seen by health care provider: Secondary | ICD-10-CM | POA: Insufficient documentation

## 2018-05-20 DIAGNOSIS — F411 Generalized anxiety disorder: Secondary | ICD-10-CM | POA: Diagnosis present

## 2018-05-20 DIAGNOSIS — R45851 Suicidal ideations: Secondary | ICD-10-CM

## 2018-05-20 DIAGNOSIS — F410 Panic disorder [episodic paroxysmal anxiety] without agoraphobia: Secondary | ICD-10-CM

## 2018-05-20 DIAGNOSIS — Z79899 Other long term (current) drug therapy: Secondary | ICD-10-CM | POA: Insufficient documentation

## 2018-05-20 DIAGNOSIS — F22 Delusional disorders: Secondary | ICD-10-CM | POA: Insufficient documentation

## 2018-05-20 HISTORY — DX: Anxiety disorder, unspecified: F41.9

## 2018-05-20 LAB — I-STAT BETA HCG BLOOD, ED (MC, WL, AP ONLY)

## 2018-05-20 LAB — COMPREHENSIVE METABOLIC PANEL
ALT: 14 U/L (ref 0–44)
AST: 20 U/L (ref 15–41)
Albumin: 4.5 g/dL (ref 3.5–5.0)
Alkaline Phosphatase: 56 U/L (ref 38–126)
Anion gap: 12 (ref 5–15)
BILIRUBIN TOTAL: 0.9 mg/dL (ref 0.3–1.2)
BUN: 10 mg/dL (ref 6–20)
CHLORIDE: 107 mmol/L (ref 98–111)
CO2: 23 mmol/L (ref 22–32)
CREATININE: 0.78 mg/dL (ref 0.44–1.00)
Calcium: 9.5 mg/dL (ref 8.9–10.3)
GFR calc Af Amer: >60 mL/min (ref >60)
Glucose, Bld: 88 mg/dL (ref 70–99)
Potassium: 3.8 mmol/L (ref 3.5–5.1)
Sodium: 142 mmol/L (ref 135–145)
Total Protein: 8.2 g/dL — ABNORMAL HIGH (ref 6.5–8.1)

## 2018-05-20 LAB — CBC WITH DIFFERENTIAL/PLATELET
Abs Immature Granulocytes: 0.02 10*3/uL (ref 0.00–0.07)
BASOS ABS: 0 10*3/uL (ref 0.0–0.1)
Basophils Relative: 0 %
EOS ABS: 0 10*3/uL (ref 0.0–0.5)
Eosinophils Relative: 0 %
HEMATOCRIT: 39.9 % (ref 36.0–46.0)
Hemoglobin: 12.6 g/dL (ref 12.0–15.0)
Immature Granulocytes: 0 %
LYMPHS ABS: 2.4 10*3/uL (ref 0.7–4.0)
Lymphocytes Relative: 33 %
MCH: 30.3 pg (ref 26.0–34.0)
MCHC: 31.6 g/dL (ref 30.0–36.0)
MCV: 95.9 fL (ref 80.0–100.0)
Monocytes Absolute: 0.7 10*3/uL (ref 0.1–1.0)
Monocytes Relative: 9 %
NEUTROS PCT: 58 %
NRBC: 0 % (ref 0.0–0.2)
Neutro Abs: 4.1 10*3/uL (ref 1.7–7.7)
Platelets: 304 10*3/uL (ref 150–400)
RBC: 4.16 MIL/uL (ref 3.87–5.11)
RDW: 11.8 % (ref 11.5–15.5)
WBC: 7.2 10*3/uL (ref 4.0–10.5)

## 2018-05-20 LAB — RAPID URINE DRUG SCREEN, HOSP PERFORMED
Amphetamines: NOT DETECTED
Barbiturates: NOT DETECTED
Benzodiazepines: NOT DETECTED
Cocaine: NOT DETECTED
OPIATES: NOT DETECTED
TETRAHYDROCANNABINOL: NOT DETECTED

## 2018-05-20 MED ORDER — LORAZEPAM 1 MG PO TABS
2.0000 mg | ORAL_TABLET | Freq: Four times a day (QID) | ORAL | Status: DC | PRN
  Administered 2018-05-20: 2 mg via ORAL
  Filled 2018-05-20: qty 2

## 2018-05-20 MED ORDER — HALOPERIDOL 5 MG PO TABS: 5.0000 mg | ORAL_TABLET | Freq: Four times a day (QID) | ORAL | Status: DC | PRN

## 2018-05-20 MED ORDER — HALOPERIDOL 5 MG PO TABS
5.0000 mg | ORAL_TABLET | Freq: Once | ORAL | Status: AC
  Administered 2018-05-20: 5 mg via ORAL
  Filled 2018-05-20: qty 1

## 2018-05-20 MED ORDER — DIPHENHYDRAMINE HCL 50 MG/ML IJ SOLN
50.0000 mg | Freq: Four times a day (QID) | INTRAMUSCULAR | Status: DC | PRN
Start: 2018-05-20 — End: 2018-05-21

## 2018-05-20 MED ORDER — HYDROXYZINE HCL 25 MG PO TABS: 50.0000 mg | ORAL_TABLET | Freq: Four times a day (QID) | ORAL | Status: DC | PRN

## 2018-05-20 MED ORDER — LORAZEPAM 2 MG/ML IJ SOLN: 2.0000 mg | Freq: Four times a day (QID) | INTRAMUSCULAR | Status: DC | PRN

## 2018-05-20 MED ORDER — HALOPERIDOL LACTATE 5 MG/ML IJ SOLN: 5.0000 mg | Freq: Four times a day (QID) | INTRAMUSCULAR | Status: DC | PRN

## 2018-05-20 MED ORDER — TRAZODONE HCL 100 MG PO TABS: 100.0000 mg | ORAL_TABLET | Freq: Every evening | ORAL | Status: DC | PRN

## 2018-05-20 NOTE — ED Notes (Signed)
Pt presents with Anxiety and Panic Attacks x 3 days.  Pt reports she had thoughts of SI, no plan, but denies at present.  Denies AVH at present.  Denies hopeless feelings.  Pt reports she has not had Zoloft med in mos.  Denies Alcohol or Drugs.  AxO x 3, no distress noted cooperative and anxious.  Pt irritable at present, rapid speech noted.  Monitoring for safety, Q 15 min checks in effect.

## 2018-05-20 NOTE — ED Notes (Signed)
Patient wanded 

## 2018-05-20 NOTE — ED Provider Notes (Signed)
[facility]   [id number] 05/20/18 Arrival Time: 1140  CC: Anxiety   SUBJECTIVE:  Latoya Guerrero is a 32 y.o. female who complains of panic attack that occurred yesterday.  Symptoms began after she flew back from [place], and a passenger began harassing her about her breath.  Patient was concerned he may have posted something about her on social media.  She feels anxiety about going outside and being around people because she is fearful someone will recognize her from social media. States she had thoughts of "hurting" herself yesterday, and was doing "research."  Her friend came over and took her medications away.  Today family told her to come here to get help.  Her family did not want her to pick-up her daughter out of fear that she may hurt herself in her daughter's presence.  Patient has a hx of anxiety and was on zoloft.  She has not taken her medication since last November.  Patient has tried OTC zoloft without relief. Denies HI.    ROS: As per HPI.  History reviewed. No pertinent past medical history. Past Surgical History:  Procedure Laterality Date  . ESOPHAGEAL MANOMETRY N/A 10/30/2017   Procedure: ESOPHAGEAL MANOMETRY (EM);  Surgeon: Charlott Rakes, MD;  Location: [facility] ENDOSCOPY;  Service: Endoscopy;  Laterality: N/A;  . PH IMPEDANCE STUDY N/A 10/30/2017   Procedure: PH IMPEDANCE STUDY;  Surgeon: Charlott Rakes, MD;  Location: [facility] ENDOSCOPY;  Service: Endoscopy;  Laterality: N/A;  . TONSILLECTOMY    . VAGINAL DELIVERY     Allergies  Allergen Reactions  . Amoxicillin Hives and Rash  . Ibuprofen Hives and Rash  . Naproxen Sodium Hives and Rash   No current facility-administered medications on file prior to encounter.    Current Outpatient Medications on File Prior to Encounter  Medication Sig Dispense Refill  . sertraline (ZOLOFT) 100 MG tablet Take 100 mg by mouth daily.    . fluticasone (FLONASE) 50 MCG/ACT nasal spray Place 2 sprays into both nostrils at  bedtime. (Patient not taking: Reported on 05/20/2018) 16 g 0  . Levonorgestrel-Ethinyl Estradiol (SEASONIQUE) 0.15-0.03 &0.01 MG tablet Take 1 tablet by mouth daily.    Marland Kitchen oseltamivir (TAMIFLU) 75 MG capsule Take 1 capsule (75 mg total) by mouth 2 (two) times daily. X 5 days (Patient not taking: Reported on 05/20/2018) 10 capsule 0   Social History   Socioeconomic History  . Marital status: Single    Spouse name: Not on file  . Number of children: Not on file  . Years of education: Not on file  . Highest education level: Not on file  Occupational History  . Not on file  Social Needs  . Financial resource strain: Not on file  . Food insecurity:    Worry: Not on file    Inability: Not on file  . Transportation needs:    Medical: Not on file    Non-medical: Not on file  Tobacco Use  . Smoking status: Never Smoker  . Smokeless tobacco: Never Used  Substance and Sexual Activity  . Alcohol use: Yes    Comment: Occas shots of liquor  . Drug use: No  . Sexual activity: Yes    Birth control/protection: Pill  Lifestyle  . Physical activity:    Days per week: Not on file    Minutes per session: Not on file  . Stress: Not on file  Relationships  . Social connections:    Talks on phone: Not on file  Gets together: Not on file    Attends religious service: Not on file    Active member of club or organization: Not on file    Attends meetings of clubs or organizations: Not on file    Relationship status: Not on file  . Intimate partner violence:    Fear of current or ex partner: Not on file    Emotionally abused: Not on file    Physically abused: Not on file    Forced sexual activity: Not on file  Other Topics Concern  . Not on file  Social History Narrative  . Not on file   Family History  Problem Relation Age of Onset  . Hypertension Mother   . Cancer Mother        skin    OBJECTIVE:  Vitals:   05/20/18 1226  BP: (!) 156/82  Pulse: 81  Resp: 16  Temp: 99 F (37.2  C)  SpO2: 98%    General appearance: alert; well groomed Eyes: EOMI grossly;  HENT: normocephalic; atraumatic Neck: FROM Lungs: normal respiratory effort Extremities: no edema; symmetrical with no gross deformities Skin: warm and dry Neurologic: ambulates without difficulty Psychological: alert and cooperative; Mood: anxious, Affect: anxious and depressed; Speech: speech normal and clear; mild decreased eye contact; tearful during encounter; suicidal ideation with fears of the general public and being recognized; Judgment: understands she needs help with her thoughts of self-harm  ASSESSMENT & PLAN:  1. Suicidal ideation    Recommending further evaluation and management in the ED for thoughts of self harm Patient aware and in agreement with this plan Will be escorted by Urgent Care staff to ensure the patient goes to the ED  Reviewed expectations re: course of current medical issues. Questions answered. Outlined signs and symptoms indicating need for more acute intervention. Patient verbalized understanding. After Visit Summary given.   Rennis Harding, PA-C 05/20/18 1650

## 2018-05-20 NOTE — ED Notes (Signed)
Pt refusing EKG, pt sts she dosent feel like anything is wrong with her heart.

## 2018-05-20 NOTE — Discharge Instructions (Addendum)
Recommending further evaluation and management in the ED for thoughts of self harm Patient aware and in agreement with this plan Will be escorted by Urgent Care staff to ensure the patient goes to the ED

## 2018-05-20 NOTE — ED Notes (Signed)
Bed: WBH37 Expected date:  Expected time:  Means of arrival:  Comments: TR4 

## 2018-05-20 NOTE — BH Assessment (Signed)
Assessment Note  Latoya Guerrero is a 32 y.o. female who presented to [facility] on voluntary basis with complaint of suicidal ideation, panic, and auditory hallucination.  There may be evidence of paranoid thinking.  PT presented to the ED in hopes of receiving a prescription for anti-anxiety medication.  She has not been assessed by TTS before.  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  Pt provided history.    Pt reported that she has been treated for anxiety for an unspecified period of time.  Pt is prescribed Zoloft through her PCP.  Pt reported that she was on a ''girls trip'' to [place] last weekend, and it was an unpleasant trip.  She stated that on the plane ride home, a fellow passenger started complaining about her breath, causing Pt to have a panic attack.  Pt stated that while boarding a connecting flight, others started complaining about her breath as well.  Pt reported that when she got home, she felt so depressed and humiliated that she cannot leave her home.  She reported feeling suicidal, and she stated that yesterday she started to research painless ways to commit suicide online.  Pt also reported that when she returned from the trip, she began to hear voices.  Pt endorsed continued suicidal ideation, despondency, insomnia (about 2 hours per night), and reduce insomnia.  She also reported daily panic attacks.  Pt said she was referred to the ED by her PCP after expressing suicidal ideation.  Pt exhibited little insight into the seriousness of expressing suicidal ideation; Pt said she was chiefly concerned that the altercation/confrontation on the airplane was recorded and would be put on social media.  Pt said she feels too humiliated to leave her home.  During assessment, Pt presented as alert and oriented.  She had good eye contact and was cooperative.  Pt was dressed in street clothes, and she appeared appropriately groomed.  Pt's demeanor was  preoccupied.  Pt's mood was sad, preoccupied, and apprehensive (Pt wanted to know what I was writing down and if it would go into her permanent file), and affect was mood-congruent.  Pt endorsed suicidal ideation, recent hallucination, panic, and other symptoms.  Pt's speech was normal in rate, rhythm, and volume.  Pt's thought processes were within normal range, and thought content was logical and goal-oriented.  Pt's preoccupation with people laughing at her in the airplane suggests but is not definitive of paranoid thinking.  Pt's memory and concentration were intact.  Insight, judgment, and impulse control were fair.  Consulted with S. Rankin, NP who determined as follows:  Pt meets inpatient criteria, and it is recommended that she be placed due to suicidal ideation, hallucination, and possible paranoia.  Diagnosis: Brief Psychotic Disorder; Panic Disorder  Past Medical History:  Past Medical History:  Diagnosis Date  . Anxiety     Past Surgical History:  Procedure Laterality Date  . ESOPHAGEAL MANOMETRY N/A 10/30/2017   Procedure: ESOPHAGEAL MANOMETRY (EM);  Surgeon: Charlott Rakes, MD;  Location: [facility];  Service: Endoscopy;  Laterality: N/A;  . PH IMPEDANCE STUDY N/A 10/30/2017   Procedure: PH IMPEDANCE STUDY;  Surgeon: Charlott Rakes, MD;  Location: [facility];  Service: Endoscopy;  Laterality: N/A;  . TONSILLECTOMY    . VAGINAL DELIVERY      Family History:  Family History  Problem Relation Age of Onset  . Hypertension Mother   . Cancer Mother  skin    Social History:  reports that she has never smoked. She has never used smokeless tobacco. She reports that she drinks alcohol. She reports that she does not use drugs.  Additional Social History:  Alcohol / Drug Use Pain Medications: See MAR Prescriptions: See MAR Over the Counter: See MAR History of alcohol / drug use?: No history of alcohol / drug abuse  CIWA: CIWA-Ar BP: 126/89 Pulse Rate:  74 COWS:    Allergies:  Allergies  Allergen Reactions  . Amoxicillin Hives and Rash    Has patient had a PCN reaction causing immediate rash, facial/tongue/throat swelling, SOB or lightheadedness with hypotension: Yes Has patient had a PCN reaction causing severe rash involving mucus membranes or skin necrosis: No Has patient had a PCN reaction that required hospitalization: No Has patient had a PCN reaction occurring within the last 10 years: Yes If all of the above answers are "NO", then may proceed with Cephalosporin use.   . Ibuprofen Hives and Rash  . Naproxen Sodium Hives and Rash    Home Medications:  (Not in a hospital admission)  OB/GYN Status:  No LMP recorded. (Menstrual status: Oral contraceptives).  General Assessment Data Location of Assessment: [facility] TTS Assessment: In system Is this a Tele or Face-to-Face Assessment?: Face-to-Face Is this an Initial Assessment or a Re-assessment for this encounter?: Initial Assessment Patient Accompanied by:: N/A Language Other than English: No Living Arrangements: Other (Comment)(Home with family) What gender do you identify as?: Female Marital status: Single Maiden name: Shands Pregnancy Status: No Living Arrangements: Children Can pt return to current living arrangement?: Yes Admission Status: Voluntary Is patient capable of signing voluntary admission?: Yes Referral Source: Self/Family/Friend Insurance type: BCBS     Crisis Care Plan Living Arrangements: Children Name of Psychiatrist: None Name of Therapist: NOne  Education Status Is patient currently in school?: No Is the patient employed, unemployed or receiving disability?: Employed(Employed as Runner, broadcasting/film/video)  Risk to self with the past 6 months Suicidal Ideation: Yes-Currently Present Has patient been a risk to self within the past 6 months prior to admission? : No Suicidal Intent: Yes-Currently Present Has patient had any suicidal intent within the past 6  months prior to admission? : No Is patient at risk for suicide?: Yes Suicidal Plan?: No Has patient had any suicidal plan within the past 6 months prior to admission? : No Access to Means: No What has been your use of drugs/alcohol within the last 12 months?: Pt denied Previous Attempts/Gestures: No Intentional Self Injurious Behavior: None Family Suicide History: No Recent stressful life event(s): Conflict (Comment)(Altercation on airplane) Persecutory voices/beliefs?: No Depression: Yes Depression Symptoms: Despondent, Insomnia, Feeling worthless/self pity Substance abuse history and/or treatment for substance abuse?: No Suicide prevention information given to non-admitted patients: Not applicable  Risk to Others within the past 6 months Homicidal Ideation: No Does patient have any lifetime risk of violence toward others beyond the six months prior to admission? : No Thoughts of Harm to Others: No Current Homicidal Intent: No Current Homicidal Plan: No Access to Homicidal Means: No History of harm to others?: No Assessment of Violence: None Noted Does patient have access to weapons?: No Criminal Charges Pending?: No Does patient have a court date: No Is patient on probation?: No  Psychosis Hallucinations: Auditory(Pt reported recently hearing voices) Delusions: (Possible -- see notes)  Mental Status Report Appearance/Hygiene: Unremarkable, Other (Comment)(Street clothes) Eye Contact: Good Motor Activity: Freedom of movement, Unremarkable Speech: Logical/coherent Level of Consciousness: Alert Mood: Suspicious,  Apprehensive, Preoccupied, Sad Affect: Appropriate to circumstance Anxiety Level: Panic Attacks Panic attack frequency: Daily Most recent panic attack: 05/20/18 Thought Processes: Relevant, Coherent Judgement: Partial Orientation: Person, Place, Time, Appropriate for developmental age Obsessive Compulsive Thoughts/Behaviors: None  Cognitive  Functioning Concentration: Normal Memory: Recent Intact, Remote Intact Is patient IDD: No Insight: Poor Impulse Control: Fair Appetite: Poor Have you had any weight changes? : No Change Sleep: Decreased Total Hours of Sleep: 2 Vegetative Symptoms: (Afraid to leave house)  ADLScreening [facility] Assessment Services) Patient's cognitive ability adequate to safely complete daily activities?: Yes Patient able to express need for assistance with ADLs?: Yes Independently performs ADLs?: Yes (appropriate for developmental age)  Prior Inpatient Therapy Prior Inpatient Therapy: No  Prior Outpatient Therapy Prior Outpatient Therapy: No Does patient have an ACCT team?: No Does patient have Intensive In-House Services?  : No Does patient have [facility] services? : No Does patient have P4CC services?: No  ADL Screening (condition at time of admission) Patient's cognitive ability adequate to safely complete daily activities?: Yes Is the patient deaf or have difficulty hearing?: No Does the patient have difficulty seeing, even when wearing glasses/contacts?: No Does the patient have difficulty concentrating, remembering, or making decisions?: No Patient able to express need for assistance with ADLs?: Yes Does the patient have difficulty dressing or bathing?: No Independently performs ADLs?: Yes (appropriate for developmental age) Does the patient have difficulty walking or climbing stairs?: No Weakness of Legs: None Weakness of Arms/Hands: None  Home Assistive Devices/Equipment Home Assistive Devices/Equipment: None  Therapy Consults (therapy consults require a physician order) PT Evaluation Needed: No OT Evalulation Needed: No SLP Evaluation Needed: No Abuse/Neglect Assessment (Assessment to be complete while patient is alone) Abuse/Neglect Assessment Can Be Completed: Yes Physical Abuse: Denies Verbal Abuse: Denies Sexual Abuse: Denies Exploitation of patient/patient's resources:  Denies Values / Beliefs Cultural Requests During Hospitalization: None Spiritual Requests During Hospitalization: None Consults Spiritual Care Consult Needed: No Social Work Consult Needed: No Merchant navy officer (For Healthcare) Does Patient Have a Medical Advance Directive?: No          Disposition:  Disposition Initial Assessment Completed for this Encounter: Yes Disposition of Patient: Admit Type of inpatient treatment program: Adult(Per S. Rankin, Pt to be i/p; psych eval in AM)  On Site Evaluation by:   Reviewed with Physician:    Dorris Fetch Talyn Dessert 05/20/2018 5:51 PM

## 2018-05-20 NOTE — ED Notes (Signed)
Bed: [facility] Expected date:  Expected time:  Means of arrival:  Comments: 

## 2018-05-20 NOTE — ED Triage Notes (Signed)
Pt in c/o panic attack, states it started while she was on an airplane on Monday, since that time anxiety symptoms have continued, pt agitated on arrival, denies SI/HI, pt takes zoloft and she took that yesterday, no medication today, pt is fixated on this encounter at the airport

## 2018-05-20 NOTE — ED Triage Notes (Signed)
Patient here from home with complaints of anxiety attacks. Denies SI currently. Reports that she has a script for Zoloft but has not taken it in awhile. "I just need something else for my anxiety".

## 2018-05-20 NOTE — ED Notes (Signed)
Patient refused EKG.

## 2018-05-20 NOTE — ED Triage Notes (Signed)
Pt states she does not want to wait any longer, is leaving, ambulatory out of pod A

## 2018-05-20 NOTE — ED Provider Notes (Signed)
[facility]-EMERGENCY DEPT Provider Note  CSN: [id number] Arrival date & time: 05/20/18  1513   History   Chief Complaint Chief Complaint  Patient presents with  . Panic Attack  . Anxiety    HPI Latoya Guerrero is a 32 y.o. female with a documented psychiatric history of anxiety who presented to the ED voluntarily for paranoia and suicidal ideation. Patient reports increased anxiety and depressed mood in the context of interpersonal conflict that occurred during her vacation to [place]. Patient is evasive when talking about this, but states that other people on the trip with her "constantly talking about me and looking at me." She reports that she also began to hear voices, but is unable to state the content of them. She states that all of this increased her anxiety levels. Patient admits to feeling paranoid and talks at length about people in the ED recognizing her and states "I don't want people to think I'm crazy. I know they are saying I'm crazy that's why I'm wearing this mask."   Patient reports that the anxiety and paranoia increased to the point where she "didn't want to do this anymore" and began to have suicidal ideation. Patient states that she researched ways to commit suicide stating "I was trying to find a way that is quick and not messy." She continues to endorse SI, but does not have a plan. Patient also reports decreased sleep over the last week endorsing about 4 hours a night, but states "I don't feel sleepy though." Additional history obtained from patient's mentor and sister. Both state that patient has been paranoid since returning from [place] yesterday and that her speech has been bizarre. They report that the patient has not behaved like this before. They have never known the patient to have SI or comments about self-harming.  Patient states that she took 1 Zoloft yesterday which helped calm her down, but has not had any other intervention. Patient  currently not established with a psychiatrist. She has no physical complaints.  Past Medical History:  Diagnosis Date  . Anxiety     There are no active problems to display for this patient.   Past Surgical History:  Procedure Laterality Date  . ESOPHAGEAL MANOMETRY N/A 10/30/2017   Procedure: ESOPHAGEAL MANOMETRY (EM);  Surgeon: Charlott Rakes, MD;  [facility];  Service: Endoscopy;  Laterality: N/A;  . PH IMPEDANCE STUDY N/A 10/30/2017   Procedure: PH IMPEDANCE STUDY;  Surgeon: Charlott Rakes, MD;  Location[facility];  Service: Endoscopy;  Laterality: N/A;  . TONSILLECTOMY    . VAGINAL DELIVERY       OB History    Gravida  3   Para  1   Term  1   Preterm      AB  2   Living  1     SAB      TAB  2   Ectopic  0   Multiple  0   Live Births               Home Medications    Prior to Admission medications   Medication Sig Start Date End Date Taking? Authorizing Provider  BIOTIN PO Take 1 tablet by mouth daily.   Yes [provider]  esomeprazole (NEXIUM) 40 MG capsule Take 40 mg by mouth daily as needed for indigestion. 12/07/16  Yes [provider]  ibuprofen (CHILDRENS ADVIL) 100 MG/5ML suspension Take 200 mg by mouth every 4 (four) hours as needed (  pain).   Yes [provider]  Levonorgestrel-Ethinyl Estradiol (SEASONIQUE) 0.15-0.03 &0.01 MG tablet Take 1 tablet by mouth daily.   Yes [provider]  sertraline (ZOLOFT) 100 MG tablet Take 100 mg by mouth daily.   Yes [provider]  fluticasone (FLONASE) 50 MCG/ACT nasal spray Place 2 sprays into both nostrils at bedtime. Patient not taking: Reported on 05/20/2018 10/19/14   Ozella Rocks, MD  oseltamivir (TAMIFLU) 75 MG capsule Take 1 capsule (75 mg total) by mouth 2 (two) times daily. X 5 days Patient not taking: Reported on 05/20/2018 09/20/16   Hayden Rasmussen, NP    Family History Family History  Problem Relation Age of Onset  .  Hypertension Mother   . Cancer Mother        skin    Social History Social History   Tobacco Use  . Smoking status: Never Smoker  . Smokeless tobacco: Never Used  Substance Use Topics  . Alcohol use: Yes    Comment: Occas shots of liquor  . Drug use: No    Comment: Pt denied; UDS not available at time of assessment     Allergies   Amoxicillin; Ibuprofen; and Naproxen sodium   Review of Systems Review of Systems  Constitutional: Negative.   Respiratory: Negative.   Cardiovascular: Negative.   Gastrointestinal: Negative.   Genitourinary: Negative.   Musculoskeletal: Negative.   Skin: Negative.   Neurological: Negative.   Psychiatric/Behavioral: Positive for hallucinations, sleep disturbance and suicidal ideas. The patient is nervous/anxious.      Physical Exam Updated Vital Signs BP 126/89 (BP Location: Left Arm)   Pulse 74   Temp 98 F (36.7 C) (Oral)   Resp 19   SpO2 98%   Physical Exam  Constitutional: She is oriented to person, place, and time. Vital signs are normal. She appears well-developed and well-nourished. She is cooperative. No distress.  HENT:  Head: Normocephalic and atraumatic.  Eyes: Pupils are equal, round, and reactive to light. Conjunctivae, EOM and lids are normal.  Neck: Normal range of motion and full passive range of motion without pain. Neck supple.  Cardiovascular: Normal rate, regular rhythm, normal heart sounds, intact distal pulses and normal pulses.  No murmur heard. Pulmonary/Chest: Effort normal and breath sounds normal.  Musculoskeletal: Normal range of motion.  Neurological: She is alert and oriented to person, place, and time. She has normal strength and normal reflexes. No cranial nerve deficit or sensory deficit. She exhibits normal muscle tone.  Skin: Skin is warm and intact. Capillary refill takes less than 2 seconds.  No wounds or signs of self-injury.  Psychiatric: Cognition and memory are normal.  Patient has poor eye  contact. Her thought process is circumstantial and difficult to follow at times. Patient often contradicts what she says, but this is unintentional. She exhibits paranoia frequently making comments about not wanting to be around others because they will talk about her and say she is "crazy." She asks numerous questions about things being documented and written down as she does not want her visit documented because others might find out.  Attention, memory, language and fund of knowledge intact.  Nursing note and vitals reviewed.  ED Treatments / Results  Labs (all labs ordered are listed, but only abnormal results are displayed) Labs Reviewed  COMPREHENSIVE METABOLIC PANEL - Abnormal; Notable for the following components:      Result Value   Total Protein 8.2 (*)    All other components within normal limits  CBC WITH DIFFERENTIAL/PLATELET  ETHANOL  RAPID URINE DRUG SCREEN, HOSP PERFORMED  I-STAT BETA HCG BLOOD, ED ([facility], [facility], [facility] ONLY)    EKG None  Radiology No results found.  Procedures Procedures (including critical care time)  Medications Ordered in ED Medications  haloperidol lactate (HALDOL) injection 5 mg (has no administration in time range)    Or  haloperidol (HALDOL) tablet 5 mg (has no administration in time range)  LORazepam (ATIVAN) tablet 2 mg (2 mg Oral Given 05/20/18 2038)    Or  LORazepam (ATIVAN) injection 2 mg ( Intramuscular See Alternative 05/20/18 2038)  diphenhydrAMINE (BENADRYL) injection 50 mg (has no administration in time range)  traZODone (DESYREL) tablet 100 mg (has no administration in time range)  hydrOXYzine (ATARAX/VISTARIL) tablet 50 mg (has no administration in time range)  haloperidol (HALDOL) tablet 5 mg (5 mg Oral Given 05/20/18 1848)     Initial Impression / Assessment and Plan / ED Course  Triage vital signs and the nursing notes have been reviewed.  Pertinent labs & imaging results that were available during care of the patient were  reviewed and considered in medical decision making (see chart for details).  Patient presents to the ED voluntarily for psychosis and SI. Patient continues to endorse SI with no specific plan, but states that she has began researching ways to commit suicide in a "quick, painless way." She exhibits paranoia throughout the entire encounter as detailed above. Patient has no previous history of bipolar or primary psychotic disorder. Given history and clinical presentation today, there is concern that patient is experiencing first psychotic vs hypomanic/manic episode. While patient at times makes future oriented statements about going to work, she continues to endorse SI and states that she wants to go home and be alone. Patient is at high risk of self-harm given mood, AH and paranoia. Will order TTS consult for further psychiatric evaluation.  Patient has no physical complaints and her physical exam is unremarkable. Will order basic blood work for medical clearance.  Clinical Course as of May 21 2039  Wed May 20, 2018  1630 PO Haldol 5mg  ordered for psychosis.   [GM]  1943 Labs unremarkable.   [GM]  1952 Case discussed with TTS who recommends that patient be monitored overnight and will be evaluated by psychiatrist in the morning. This was reviewed with the patient who agrees to stay overnight. At this time, IVC is not necessary. However, IVC is recommended should patient become combative and request to leave.    [GM]    Clinical Course User Index [GM] Kristinia Leavy, Sharyon Medicus, PA-C    Final Clinical Impressions(s) / ED Diagnoses  1. Psychosis. PO Haldol 5mg  x1 given in the ED. Home medications ordered. PRN medications ordered for acute agitation or psychosis.  Dispo: Pending psychiatry evaluation in the AM on 05/21/18.  Final diagnoses:  None    ED Discharge Orders    None        Reva Bores 05/20/18 2040    Linwood Dibbles, MD 05/20/18 [phone]

## 2018-05-20 NOTE — ED Notes (Signed)
Per provider, do not change out patient.

## 2018-05-20 NOTE — ED Triage Notes (Signed)
Pt c/o panic attack yesterday, tried calling several mental health places without help. Pt has scrip for zoloft but hasn't been on it for a while. Pt took it yesterday but it made her feel jittery. Pt states her PCP told her they couldn't give her anything to calm her down right now. Pt is irritable. Denies si/hi at this time.

## 2018-05-21 DIAGNOSIS — F410 Panic disorder [episodic paroxysmal anxiety] without agoraphobia: Secondary | ICD-10-CM

## 2018-05-21 DIAGNOSIS — F411 Generalized anxiety disorder: Secondary | ICD-10-CM | POA: Diagnosis not present

## 2018-05-21 DIAGNOSIS — R196 Halitosis: Secondary | ICD-10-CM | POA: Diagnosis not present

## 2018-05-21 DIAGNOSIS — K219 Gastro-esophageal reflux disease without esophagitis: Secondary | ICD-10-CM | POA: Diagnosis not present

## 2018-05-21 LAB — URINALYSIS, ROUTINE W REFLEX MICROSCOPIC
BILIRUBIN URINE: NEGATIVE
Glucose, UA: NEGATIVE mg/dL
HGB URINE DIPSTICK: NEGATIVE
KETONES UR: NEGATIVE mg/dL
Leukocytes, UA: NEGATIVE
Nitrite: NEGATIVE
PH: 6 (ref 5.0–8.0)
Protein, ur: NEGATIVE mg/dL
SPECIFIC GRAVITY, URINE: 1.023 (ref 1.005–1.030)

## 2018-05-21 NOTE — ED Notes (Signed)
Pt was made aware of need to provide urine specimen. She had just voided but said she would try.

## 2018-05-21 NOTE — ED Notes (Signed)
Breakfast tray at bedside. Pt is resting, with eyes closed and regular/even respirations. Will monitor for needs/safety.  

## 2018-05-21 NOTE — ED Notes (Signed)
Patient was discharged per order. AVS was reviewed with patient. Pt was given an opportunity to ask questions and verbalized understanding of all discharge paperwork. Belongings were returned, and patient signed for receipt. Patient verbalized readiness for discharge and appeared in no acute distress when escorted to lobby.  

## 2018-05-21 NOTE — BH Assessment (Signed)
[facility] Assessment Progress Note  Per Juanetta Beets, DO, this pt does not require psychiatric hospitalization at this time.  Pt is to be discharged from [facility] with recommendation to follow up with an outpatient psychiatrist.  This writer spoke to pt, and she requests that I schedule an appointment for her, with a caveat that appointment would need to be after 15:30.  I spoke to Adrika at [facility], who has scheduled pt for an intake appointment on Monday 06/01/2018 at 16:00.  This has been included in pt's discharge instructions.  Pt's nurse, Clydie Braun, has been notified.  Doylene Canning, MA Triage Specialist [phone]

## 2018-05-21 NOTE — Consult Note (Addendum)
[facility] ED Discharge  05/21/2018 11:00 AM Latoya Guerrero  MRN:  [id number] Principal Problem: GAD (generalized anxiety disorder) Discharge Diagnoses:  Patient Active Problem List   Diagnosis Date Noted  . GAD (generalized anxiety disorder) [F41.1] 05/21/2018    Subjective: Pt was seen and chart reviewed with treatment team and Dr Sharma Covert. Pt denies suicidal/homicidal ideation, denies auditory/visual hallucinations and does not appear to be responding to internal stimuli. Pt stated she went on a trip to [place] with some girlfriends and there were several personality conflicts. She stated she called her mother on Thursday and told her she wanted to harm herself because the trip was not going well. Per note review: Pt stated there were people complaining about her breath and this made her self-conscious. Pt also stated she has been seeing a gastroenterologist for some GI issues and she has a follow up appointment today. She did not specify who she sees or why she is seeing them. She was focused on being discharged and going to her appointment and getting back to work. 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 Pt stated she was put on Zoloft by her PCP at [facility] but stopped taking them in April or May. She stated she will go to see her PCP upon discharge and would also like resources for outpatient medication management for her anti anxiety medications. Pt's UDS and BAL are negative.  Pt is psychiatrically clear for discharge.  Total Time spent with patient: 30 minutes  Past Psychiatric History: As above  Past Medical History:  Past Medical History:  Diagnosis Date  . Anxiety    Past Surgical History:  Procedure Laterality Date  . ESOPHAGEAL MANOMETRY N/A 10/30/2017   Procedure: ESOPHAGEAL MANOMETRY (EM);  Surgeon: Charlott Rakes, MD;  Location: [facility];  Service: Endoscopy;  Laterality: N/A;  . PH IMPEDANCE STUDY N/A 10/30/2017   Procedure: PH IMPEDANCE STUDY;  Surgeon:  Charlott Rakes, MD;  Location: [facility];  Service: Endoscopy;  Laterality: N/A;  . TONSILLECTOMY    . VAGINAL DELIVERY     Family History:  Family History  Problem Relation Age of Onset  . Hypertension Mother   . Cancer Mother        skin   Family Psychiatric  History: Pt denies any family mental health history Social History:  Social History   Substance and Sexual Activity  Alcohol Use Yes   Comment: Occas shots of liquor    Social History   Substance and Sexual Activity  Drug Use No   Comment: Pt denied; UDS not available at time of assessment   Social History   Socioeconomic History  . Marital status: Single    Spouse name: Not on file  . Number of children: Not on file  . Years of education: Not on file  . Highest education level: Not on file  Occupational History  . Occupation: Runner, broadcasting/film/video  Social Needs  . Financial resource strain: Not on file  . Food insecurity:    Worry: Not on file    Inability: Not on file  . Transportation needs:    Medical: Not on file    Non-medical: Not on file  Tobacco Use  . Smoking status: Never Smoker  . Smokeless tobacco: Never Used  Substance and Sexual Activity  . Alcohol use: Yes    Comment: Occas shots of liquor  . Drug use: No    Comment: Pt denied; UDS not available at time of assessment  . Sexual  activity: Yes    Birth control/protection: Pill  Lifestyle  . Physical activity:    Days per week: Not on file    Minutes per session: Not on file  . Stress: Not on file  Relationships  . Social connections:    Talks on phone: Not on file    Gets together: Not on file    Attends religious service: Not on file    Active member of club or organization: Not on file    Attends meetings of clubs or organizations: Not on file    Relationship status: Not on file  Other Topics Concern  . Not on file  Social History Narrative   Pt lives in [place] with her daughter.  She works as a Runner, broadcasting/film/video.      Has this patient  used any form of tobacco in the last 30 days? (Cigarettes, Smokeless Tobacco, Cigars, and/or Pipes) Prescription not provided because: Patient does not use tobacco products.  Current Medications: Current Facility-Administered Medications  Medication Dose Route Frequency Provider Last Rate Last Dose  . diphenhydrAMINE (BENADRYL) injection 50 mg  50 mg Intravenous Q6H PRN Mortis, Gabrielle I, PA-C      . haloperidol lactate (HALDOL) injection 5 mg  5 mg Intramuscular Q6H PRN Mortis, Gabrielle I, PA-C       Or  . haloperidol (HALDOL) tablet 5 mg  5 mg Oral Q6H PRN Mortis, Gabrielle I, PA-C      . hydrOXYzine (ATARAX/VISTARIL) tablet 50 mg  50 mg Oral Q6H PRN Mortis, Gabrielle I, PA-C      . LORazepam (ATIVAN) tablet 2 mg  2 mg Oral Q6H PRN Mortis, Gabrielle I, PA-C   2 mg at 05/20/18 2038   Or  . LORazepam (ATIVAN) injection 2 mg  2 mg Intramuscular Q6H PRN Mortis, Gabrielle I, PA-C      . traZODone (DESYREL) tablet 100 mg  100 mg Oral QHS PRN Mortis, Sharyon Medicus, PA-C       Current Outpatient Medications  Medication Sig Dispense Refill  . BIOTIN PO Take 1 tablet by mouth daily.    Marland Kitchen esomeprazole (NEXIUM) 40 MG capsule Take 40 mg by mouth daily as needed for indigestion.    Marland Kitchen ibuprofen (CHILDRENS ADVIL) 100 MG/5ML suspension Take 200 mg by mouth every 4 (four) hours as needed (pain).    . Levonorgestrel-Ethinyl Estradiol (SEASONIQUE) 0.15-0.03 &0.01 MG tablet Take 1 tablet by mouth daily.    . sertraline (ZOLOFT) 100 MG tablet Take 100 mg by mouth daily.    . fluticasone (FLONASE) 50 MCG/ACT nasal spray Place 2 sprays into both nostrils at bedtime. (Patient not taking: Reported on 05/20/2018) 16 g 0  . oseltamivir (TAMIFLU) 75 MG capsule Take 1 capsule (75 mg total) by mouth 2 (two) times daily. X 5 days (Patient not taking: Reported on 05/20/2018) 10 capsule 0    Musculoskeletal: Strength & Muscle Tone: within normal limits Gait & Station: normal Patient leans: N/A  Psychiatric Specialty  Exam: Physical Exam  Nursing note and vitals reviewed. Constitutional: She is oriented to person, place, and time. She appears well-developed and well-nourished.  HENT:  Head: Normocephalic.  Neck: Normal range of motion.  Respiratory: Effort normal.  Musculoskeletal: Normal range of motion.  Neurological: She is alert and oriented to person, place, and time.  Psychiatric: Her speech is normal and behavior is normal. Judgment and thought content normal. Her mood appears anxious. Cognition and memory are normal.    Review of Systems  Psychiatric/Behavioral: Negative  for depression. The patient is nervous/anxious.   All other systems reviewed and are negative.   Blood pressure 118/88, pulse 88, temperature 98.8 F (37.1 C), temperature source Oral, resp. rate 16, SpO2 100 %.There is no height or weight on file to calculate BMI.  General Appearance: Casual  Eye Contact:  Good  Speech:  Clear and Coherent and Normal Rate  Volume:  Normal  Mood:  Anxious and Irritable  Affect:  Congruent  Thought Process:  Coherent, Goal Directed and Linear  Orientation:  Full (Time, Place, and Person)  Thought Content:  Logical  Suicidal Thoughts:  No  Homicidal Thoughts:  No  Memory:  Immediate;   Good Recent;   Good Remote;   Fair  Judgement:  Fair  Insight:  Fair  Psychomotor Activity:  Normal  Concentration:  Concentration: Good and Attention Span: Good  Recall:  Good  Fund of Knowledge:  Good  Language:  Fair  Akathisia:  No  Handed:  Right  AIMS (if indicated):   N/A  Assets:  Administrator, arts Vocational/Educational  ADL's:  Intact  Cognition:  WNL  Sleep:   N/A     Demographic Factors:  Adolescent or young adult  Loss Factors: NA  Historical Factors: NA  Risk Reduction Factors:   Responsible for children under 35 years of age and Sense of responsibility to family  Continued Clinical Symptoms:   Panic Attacks  Cognitive Features That Contribute To Risk:  Closed-mindedness    Suicide Risk:  Minimal: No identifiable suicidal ideation.  Patients presenting with no risk factors but with morbid ruminations; may be classified as minimal risk based on the severity of the depressive symptoms    Plan Of Care/Follow-up recommendations:  Activity:  as tolerated Diet:  heart healthy  Disposition: Generalized Anxiety Disorder Take all medications as prescribed by your outpatient provider at [facility]. Keep all follow-up appointments as scheduled and schedule an appointment as soon as possible after discharge for your medical concerns and mental health medication management.  Follow up with Dr Jackquline Berlin on 06-01-2018 at 4:00 PM for psychiatry (as in your discharge instructions)  and medication management Do not consume alcohol or use illegal drugs while on prescription medications. Report any adverse effects from your medications to your primary care provider promptly.  In the event of recurrent symptoms or worsening symptoms, call 911, a crisis hotline, or go to the nearest emergency department for evaluation.    Laveda Abbe, NP 05/21/2018, 11:00 AM   Patient seen face-to-face for psychiatric evaluation, chart reviewed and case discussed with the physician extender and developed treatment plan. Reviewed the information documented and agree with the treatment plan.  Juanetta Beets, DO 05/21/18 3:23 PM

## 2018-05-21 NOTE — Discharge Instructions (Signed)
For your behavioral health needs, you are advised to follow up with an outpatient psychiatrist.  You have an intake appointment scheduled with Neila Gear, MD on Monday, June 01, 2018 at 4:00 pm:       Neila Gear, MD      [facility]      [address]      [place], [place] [address]      [phone]

## 2018-06-16 DIAGNOSIS — F3160 Bipolar disorder, current episode mixed, unspecified: Secondary | ICD-10-CM | POA: Diagnosis not present

## 2018-07-08 ENCOUNTER — Ambulatory Visit: Payer: Self-pay | Admitting: Allergy

## 2018-07-13 DIAGNOSIS — F3160 Bipolar disorder, current episode mixed, unspecified: Secondary | ICD-10-CM | POA: Diagnosis not present

## 2018-07-28 DIAGNOSIS — J310 Chronic rhinitis: Secondary | ICD-10-CM | POA: Diagnosis not present

## 2018-07-28 DIAGNOSIS — J343 Hypertrophy of nasal turbinates: Secondary | ICD-10-CM | POA: Diagnosis not present

## 2018-07-28 DIAGNOSIS — R196 Halitosis: Secondary | ICD-10-CM | POA: Diagnosis not present

## 2018-07-29 DIAGNOSIS — F3160 Bipolar disorder, current episode mixed, unspecified: Secondary | ICD-10-CM | POA: Diagnosis not present

## 2018-08-07 IMAGING — CR DG CHEST 2V
2 series · 2 of 2 positions shown · non-contrast
Comparison: Chest x-ray of April 05, 2016

CLINICAL DATA: Positive PPD.  No current chest complaints.

EXAM:
CHEST - 2 VIEW

[w chest pa]
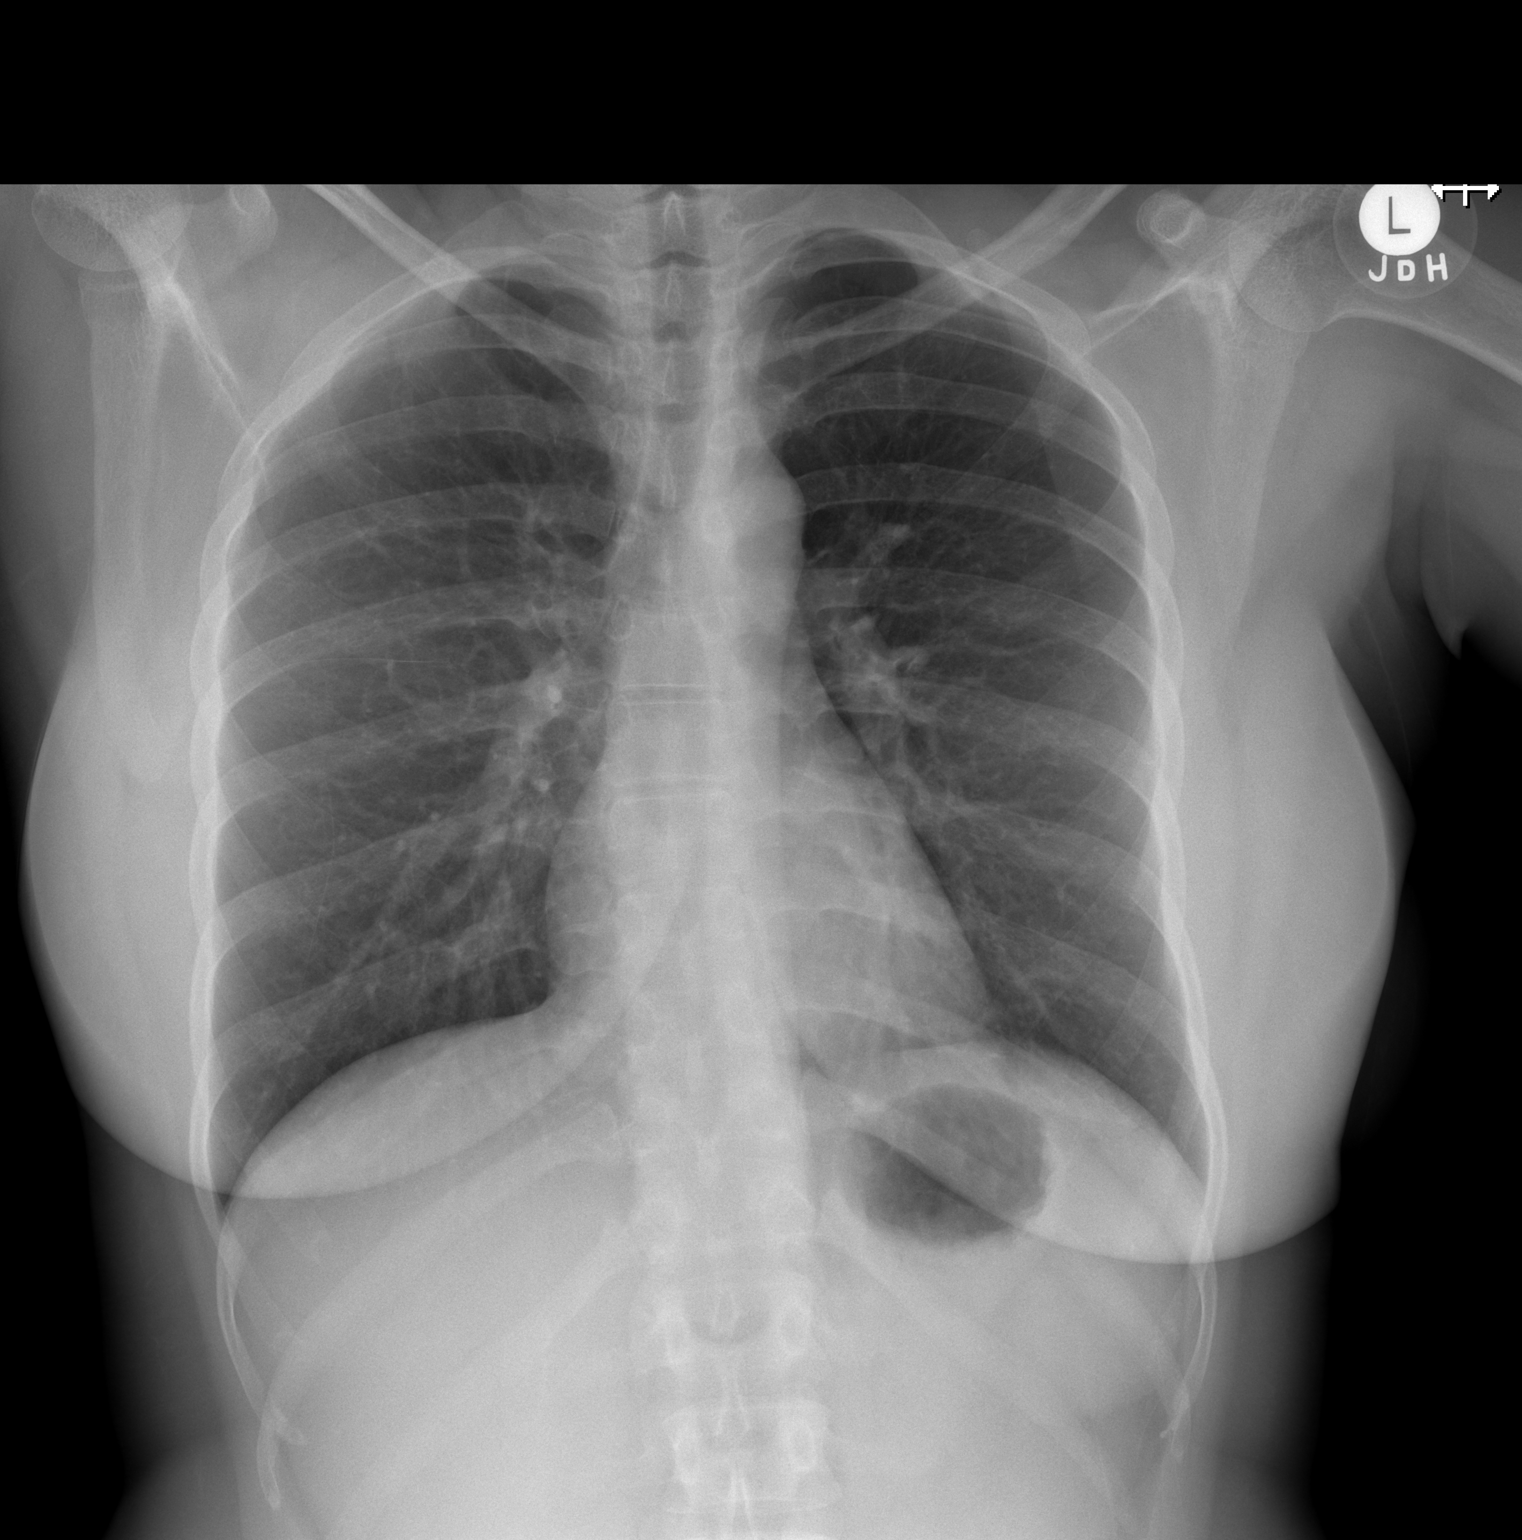

[w chest lat]
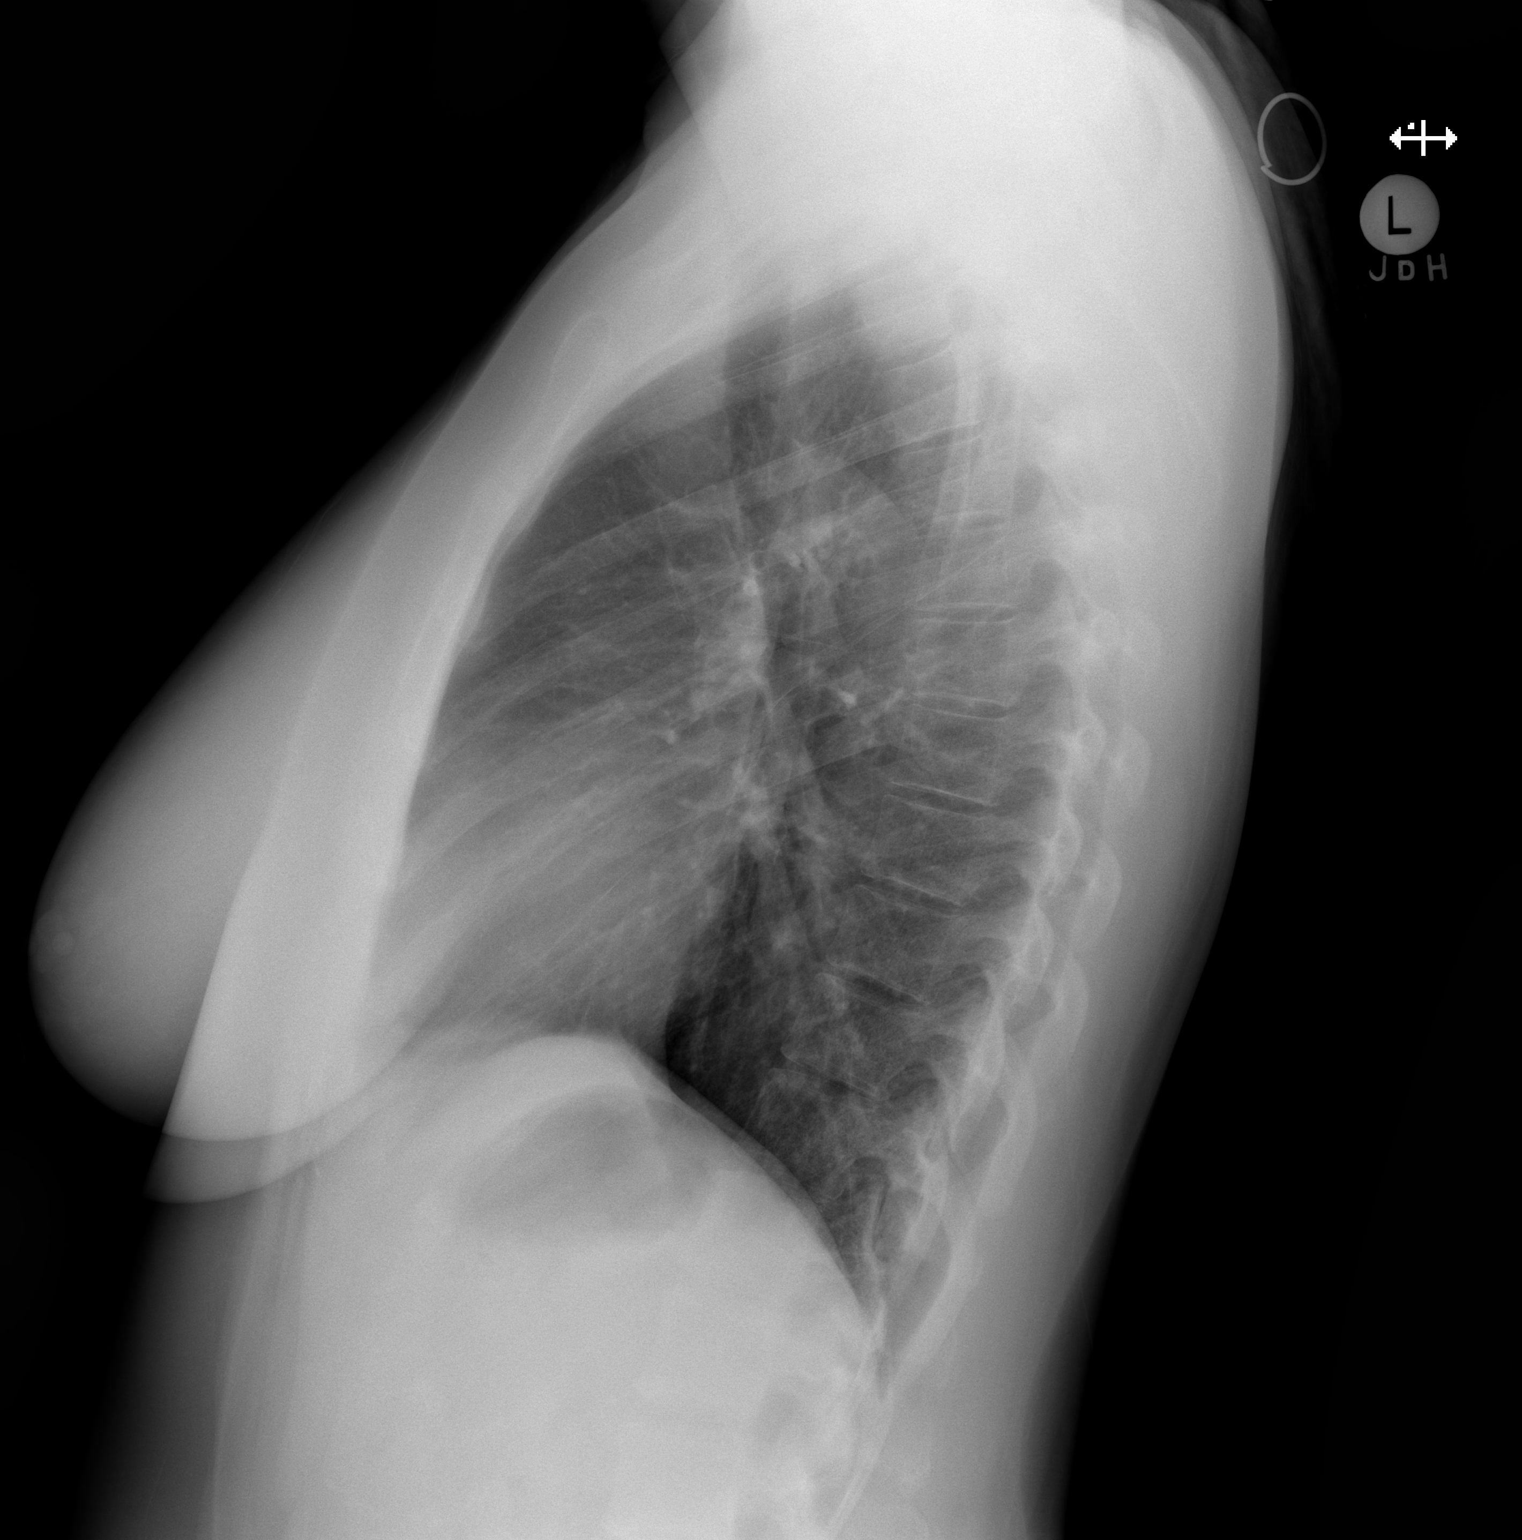

[2 of 2 positions shown; findings below may reference images not displayed]

FINDINGS: The lungs are well-expanded and clear. The heart and pulmonary
vascularity are normal. The mediastinum is normal in width. There is
no pleural effusion. The trachea is midline. The bony thorax
exhibits no acute abnormality.
IMPRESSION: There is no evidence of acute or old tuberculous infection nor other
active cardiopulmonary disease.

## 2018-08-12 NOTE — L&D Delivery Note (Signed)
DELIVERY NOTE  Pt complete and at +2 station with urge to push. Epidural controlling pain. Pt pushed and delivered a viable female infant in ROA position. Anterior and posterior shoulders spontaneously delivered with next two pushes; body easily followed next. Loose nuchal x1, easily reduced. Infant placed on mothers abdomen and bulb suction of mouth and nose performed. Cord was then clamped and cut by MD. Cord blood obtained, 3VC. Baby had a vigorous spontaneous cry noted. Placenta then delivered at 1714 intact. Fundal massage performed and pitocin per protocol. Fundus firm. The following lacerations were noted: shallow 2nd devree. Repaired in routine fashion with 2-0 vicryl. Mother and baby stable. Counts correct   Infant time: 1709 Gender: female Placenta time: 1714 Apgars: 8/9 Weight: 7lb6oz  Desires circ, will pay tomorrow

## 2018-08-14 ENCOUNTER — Encounter: Payer: Self-pay | Admitting: Allergy

## 2018-08-14 ENCOUNTER — Ambulatory Visit: Payer: BLUE CROSS/BLUE SHIELD | Admitting: Allergy

## 2018-08-14 VITALS — BP 110/84 | HR 83 | Ht 67.0 in | Wt 204.0 lb

## 2018-08-14 DIAGNOSIS — T781XXD Other adverse food reactions, not elsewhere classified, subsequent encounter: Secondary | ICD-10-CM | POA: Diagnosis not present

## 2018-08-14 DIAGNOSIS — R196 Halitosis: Secondary | ICD-10-CM

## 2018-08-14 DIAGNOSIS — K219 Gastro-esophageal reflux disease without esophagitis: Secondary | ICD-10-CM

## 2018-08-14 DIAGNOSIS — T7819XD Other adverse food reactions, not elsewhere classified, subsequent encounter: Secondary | ICD-10-CM

## 2018-08-14 DIAGNOSIS — J310 Chronic rhinitis: Secondary | ICD-10-CM | POA: Diagnosis not present

## 2018-08-14 MED ORDER — AZELASTINE-FLUTICASONE 137-50 MCG/ACT NA SUSP: 2.0000 | Freq: Every day | NASAL | 5 refills | Status: DC

## 2018-08-14 NOTE — Patient Instructions (Addendum)
-   food allergy skin testing today is negative.  You do not have IgE mediated food allergy to any foods and thus do not need to avoid any foods at this time.   - environmental allergy skin testing today is positive to molds and dust mites.   - allergen avoidance measures discussed/handouts provided - trial dymista 1 spray each nostril twice a day.  This is a combination nasal spray with Flonase + Astelin (nasal antihistamine).  This helps with both nasal congestion and drainage.   Hold your Flonase while using Dymista.   - can trial use of long-acting antihistamine like Allegra 180mg , Xyzal 5mg  or Zyrtec 10mg  daily as needed - continue nexium daily and as needed tums for acid reflux control  Follow-up 6-9 months or sooner if needed

## 2018-08-14 NOTE — Progress Notes (Signed)
New Patient Note  RE: RAAYA FILTER MRN: [id number] DOB: Jan 24, 1986 Date of Office Visit: 08/14/2018  Referring provider: Charlott Rakes, MD Primary care provider: [facility]  Chief Complaint: reflux and breath issues  History of present illness: Latoya Guerrero is a 33 y.o. female presenting today for consultation for reflux and halitosis.    She states she has been having issues with her breath and she has seen GI.  She states GI referred her to see allergist to determine if there are some foods that are contributing to her halitosis.  She has been having issues with halitosis since about 2011.  She states it has caused a lot of issues for her especially mentally.  She has had several incidences related to her breath where people have commented on it and it has gotten her down.  She states she has a good mouth care regimen including brushing and mouthwash twice a day.  She went to her dentist as well in regards to halitosis.  She states the dentist didn't find any causes of halitosis and thus dentist referred her to ENT.  She states the ENT told her based on her nasal exam that she has allergies.  He recommended she use Flonase which she has been using.  She states it has not changed her issues with halitosis.  She does report having runny nose all throughout the year and it is mainly in the mornings.  She does report a hoarse voice in the mornings. She does take nexium for GERD as well as tums.  She takes the tums for quick relief.  She has changed her diet to eliminate a lot of fast food and sodas.  She is trying to avoid alcohol as well.    She has had her tonsils removed due to history of tonsillar stones around 2011.      She denies any history of asthma or eczema.    Review of systems: Review of Systems  Constitutional: Negative for chills, fever and malaise/fatigue.  HENT: Positive for congestion. Negative for ear discharge, ear pain, nosebleeds  and sore throat.   Eyes: Negative for pain, discharge and redness.  Respiratory: Negative for cough, shortness of breath and wheezing.   Cardiovascular: Negative for chest pain.  Gastrointestinal: Positive for heartburn. Negative for abdominal pain, constipation, diarrhea, nausea and vomiting.  Musculoskeletal: Negative for joint pain.  Skin: Negative for itching and rash.  Neurological: Negative for headaches.    All other systems negative unless noted above in HPI  Past medical history: Past Medical History:  Diagnosis Date  . Anxiety   . GERD (gastroesophageal reflux disease)     Past surgical history: Past Surgical History:  Procedure Laterality Date  . ESOPHAGEAL MANOMETRY N/A 10/30/2017   Procedure: ESOPHAGEAL MANOMETRY (EM);  Surgeon: Charlott Rakes, MD;  Location: [facility];  Service: Endoscopy;  Laterality: N/A;  . PH IMPEDANCE STUDY N/A 10/30/2017   Procedure: PH IMPEDANCE STUDY;  Surgeon: Charlott Rakes, MD;  Location: [facility];  Service: Endoscopy;  Laterality: N/A;  . TONSILLECTOMY    . VAGINAL DELIVERY      Family history:  Family History  Problem Relation Age of Onset  . Hypertension Mother   . Cancer Mother        skin    Social history: Lives in a home with carpeting with gas heating and central cooling.  No pets in the home.  No concern for water damage, mildew or roaches in  the home.  She is a Education officer, museum.   Denies smoking history.    Medication List: Allergies as of 08/14/2018      Reactions   Amoxicillin Hives, Rash   Has patient had a PCN reaction causing immediate rash, facial/tongue/throat swelling, SOB or lightheadedness with hypotension: Yes Has patient had a PCN reaction causing severe rash involving mucus membranes or skin necrosis: No Has patient had a PCN reaction that required hospitalization: No Has patient had a PCN reaction occurring within the last 10 years: Yes If all of the above answers are "NO", then may  proceed with Cephalosporin use.   Ibuprofen Hives, Rash   Naproxen Sodium Hives, Rash      Medication List       Accurate as of August 14, 2018  1:57 PM. Always use your most recent med list.        BIOTIN PO Take 1 tablet by mouth daily.   CHILDRENS ADVIL 100 MG/5ML suspension Generic drug:  ibuprofen Take 200 mg by mouth every 4 (four) hours as needed (pain).   esomeprazole 40 MG capsule Commonly known as:  NEXIUM Take 40 mg by mouth daily as needed for indigestion.   fluticasone 50 MCG/ACT nasal spray Commonly known as:  FLONASE Place 2 sprays into both nostrils at bedtime.   lithium 600 MG capsule Take 600 mg by mouth 3 (three) times daily with meals.   SEASONIQUE 0.15-0.03 &0.01 MG tablet Generic drug:  Levonorgestrel-Ethinyl Estradiol Take 1 tablet by mouth daily.       Known medication allergies: Allergies  Allergen Reactions  . Amoxicillin Hives and Rash    Has patient had a PCN reaction causing immediate rash, facial/tongue/throat swelling, SOB or lightheadedness with hypotension: Yes Has patient had a PCN reaction causing severe rash involving mucus membranes or skin necrosis: No Has patient had a PCN reaction that required hospitalization: No Has patient had a PCN reaction occurring within the last 10 years: Yes If all of the above answers are "NO", then may proceed with Cephalosporin use.   . Ibuprofen Hives and Rash  . Naproxen Sodium Hives and Rash     Physical examination: Blood pressure 110/84, pulse 83, height 5\' 7"  (1.702 m), weight 204 lb (92.5 kg), SpO2 97 %.  General: Alert, interactive, in no acute distress. HEENT: PERRLA, TMs pearly gray, turbinates minimally edematous with clear discharge, post-pharynx non erythematous. Neck: Supple without lymphadenopathy. Lungs: Clear to auscultation without wheezing, rhonchi or rales. {no increased work of breathing. CV: Normal S1, S2 without murmurs. Abdomen: Nondistended, nontender. Skin: Warm  and dry, without lesions or rashes. Extremities:  No clubbing, cyanosis or edema. Neuro:   Grossly intact.  Diagnositics/Labs:  Allergy testing: environmental allergy skin prick testing is negative.  Histamine control is positive.   Select food allergy skin prick testing is negative.  Intradermal testing is positive to mold mix 1,2,3 and mite mix Allergy testing results were read and interpreted by provider, documented by clinical staff.   Assessment and plan:   Adverse food reaction/Halitosis and GERD - food allergy skin testing today is negative.  You do not have IgE mediated food allergy to any foods and thus do not need to avoid any foods at this time.   - continue nexium daily and as needed tums for acid reflux control  Rhinitis - environmental allergy skin testing today is positive to molds and dust mites.   - allergen avoidance measures discussed/handouts provided - trial dymista 1 spray each  nostril twice a day.  This is a combination nasal spray with Flonase + Astelin (nasal antihistamine).  This helps with both nasal congestion and drainage.   Hold your Flonase while using Dymista.   - can trial use of long-acting antihistamine like Allegra 180mg , Xyzal 5mg  or Zyrtec 10mg  daily as needed   Follow-up 6-9 months or sooner if needed   I appreciate the opportunity to take part in Zuri's care. Please do not hesitate to contact me with questions.  Sincerely,   Margo AyeShaylar Joniece Smotherman, MD Allergy/Immunology [facility]

## 2018-09-04 DIAGNOSIS — F3160 Bipolar disorder, current episode mixed, unspecified: Secondary | ICD-10-CM | POA: Diagnosis not present

## 2018-10-09 DIAGNOSIS — F3110 Bipolar disorder, current episode manic without psychotic features, unspecified: Secondary | ICD-10-CM | POA: Diagnosis not present

## 2018-10-09 DIAGNOSIS — Z01419 Encounter for gynecological examination (general) (routine) without abnormal findings: Secondary | ICD-10-CM | POA: Diagnosis not present

## 2018-10-09 DIAGNOSIS — Z113 Encounter for screening for infections with a predominantly sexual mode of transmission: Secondary | ICD-10-CM | POA: Diagnosis not present

## 2018-11-02 DIAGNOSIS — N760 Acute vaginitis: Secondary | ICD-10-CM | POA: Diagnosis not present

## 2018-11-02 DIAGNOSIS — F3160 Bipolar disorder, current episode mixed, unspecified: Secondary | ICD-10-CM | POA: Diagnosis not present

## 2018-11-19 DIAGNOSIS — R110 Nausea: Secondary | ICD-10-CM | POA: Diagnosis not present

## 2018-11-19 DIAGNOSIS — Z789 Other specified health status: Secondary | ICD-10-CM | POA: Diagnosis not present

## 2018-11-19 DIAGNOSIS — Z3A01 Less than 8 weeks gestation of pregnancy: Secondary | ICD-10-CM | POA: Diagnosis not present

## 2018-12-14 DIAGNOSIS — Z3201 Encounter for pregnancy test, result positive: Secondary | ICD-10-CM | POA: Diagnosis not present

## 2018-12-14 DIAGNOSIS — Z3480 Encounter for supervision of other normal pregnancy, unspecified trimester: Secondary | ICD-10-CM | POA: Diagnosis not present

## 2018-12-25 DIAGNOSIS — Z3481 Encounter for supervision of other normal pregnancy, first trimester: Secondary | ICD-10-CM | POA: Diagnosis not present

## 2019-01-06 DIAGNOSIS — Z3201 Encounter for pregnancy test, result positive: Secondary | ICD-10-CM | POA: Diagnosis not present

## 2019-01-06 DIAGNOSIS — N911 Secondary amenorrhea: Secondary | ICD-10-CM | POA: Diagnosis not present

## 2019-01-08 DIAGNOSIS — Z113 Encounter for screening for infections with a predominantly sexual mode of transmission: Secondary | ICD-10-CM | POA: Diagnosis not present

## 2019-01-08 DIAGNOSIS — Z3682 Encounter for antenatal screening for nuchal translucency: Secondary | ICD-10-CM | POA: Diagnosis not present

## 2019-01-08 DIAGNOSIS — Z3481 Encounter for supervision of other normal pregnancy, first trimester: Secondary | ICD-10-CM | POA: Diagnosis not present

## 2019-01-08 DIAGNOSIS — Z3689 Encounter for other specified antenatal screening: Secondary | ICD-10-CM | POA: Diagnosis not present

## 2019-01-08 DIAGNOSIS — Z3A12 12 weeks gestation of pregnancy: Secondary | ICD-10-CM | POA: Diagnosis not present

## 2019-01-08 LAB — OB RESULTS CONSOLE GC/CHLAMYDIA
Chlamydia: NEGATIVE
Gonorrhea: NEGATIVE

## 2019-01-08 LAB — OB RESULTS CONSOLE RPR: RPR: NONREACTIVE

## 2019-01-08 LAB — OB RESULTS CONSOLE ANTIBODY SCREEN: Antibody Screen: NEGATIVE

## 2019-01-08 LAB — OB RESULTS CONSOLE ABO/RH: RH Type: POSITIVE

## 2019-01-08 LAB — OB RESULTS CONSOLE HIV ANTIBODY (ROUTINE TESTING): HIV: NONREACTIVE

## 2019-01-08 LAB — OB RESULTS CONSOLE HEPATITIS B SURFACE ANTIGEN: Hepatitis B Surface Ag: NEGATIVE

## 2019-01-08 LAB — OB RESULTS CONSOLE RUBELLA ANTIBODY, IGM: Rubella: IMMUNE

## 2019-01-25 DIAGNOSIS — D225 Melanocytic nevi of trunk: Secondary | ICD-10-CM | POA: Diagnosis not present

## 2019-01-25 DIAGNOSIS — L858 Other specified epidermal thickening: Secondary | ICD-10-CM | POA: Diagnosis not present

## 2019-01-25 DIAGNOSIS — L700 Acne vulgaris: Secondary | ICD-10-CM | POA: Diagnosis not present

## 2019-02-23 DIAGNOSIS — Z3A19 19 weeks gestation of pregnancy: Secondary | ICD-10-CM | POA: Diagnosis not present

## 2019-02-23 DIAGNOSIS — Z3689 Encounter for other specified antenatal screening: Secondary | ICD-10-CM | POA: Diagnosis not present

## 2019-03-02 DIAGNOSIS — F3110 Bipolar disorder, current episode manic without psychotic features, unspecified: Secondary | ICD-10-CM | POA: Diagnosis not present

## 2019-03-10 DIAGNOSIS — F3110 Bipolar disorder, current episode manic without psychotic features, unspecified: Secondary | ICD-10-CM | POA: Diagnosis not present

## 2019-03-12 ENCOUNTER — Ambulatory Visit: Payer: BLUE CROSS/BLUE SHIELD | Admitting: Allergy

## 2019-03-15 DIAGNOSIS — Z20828 Contact with and (suspected) exposure to other viral communicable diseases: Secondary | ICD-10-CM | POA: Diagnosis not present

## 2019-03-16 DIAGNOSIS — J069 Acute upper respiratory infection, unspecified: Secondary | ICD-10-CM | POA: Diagnosis not present

## 2019-03-22 ENCOUNTER — Emergency Department (HOSPITAL_COMMUNITY)
Admission: EM | Admit: 2019-03-22 | Discharge: 2019-03-22 | Disposition: A | Discharge status: Home / Self Care | Payer: BC Managed Care – PPO | Attending: Emergency Medicine | Admitting: Emergency Medicine

## 2019-03-22 ENCOUNTER — Other Ambulatory Visit: Payer: Self-pay

## 2019-03-22 ENCOUNTER — Emergency Department (HOSPITAL_COMMUNITY): Payer: BC Managed Care – PPO

## 2019-03-22 ENCOUNTER — Encounter: Payer: Self-pay | Admitting: Emergency Medicine

## 2019-03-22 DIAGNOSIS — O9989 Other specified diseases and conditions complicating pregnancy, childbirth and the puerperium: Secondary | ICD-10-CM | POA: Diagnosis not present

## 2019-03-22 DIAGNOSIS — U071 COVID-19: Secondary | ICD-10-CM | POA: Diagnosis not present

## 2019-03-22 DIAGNOSIS — O98511 Other viral diseases complicating pregnancy, first trimester: Secondary | ICD-10-CM | POA: Diagnosis not present

## 2019-03-22 DIAGNOSIS — R0602 Shortness of breath: Secondary | ICD-10-CM | POA: Diagnosis not present

## 2019-03-22 DIAGNOSIS — O99511 Diseases of the respiratory system complicating pregnancy, first trimester: Secondary | ICD-10-CM | POA: Diagnosis not present

## 2019-03-22 DIAGNOSIS — Z3A00 Weeks of gestation of pregnancy not specified: Secondary | ICD-10-CM | POA: Insufficient documentation

## 2019-03-22 DIAGNOSIS — Z79899 Other long term (current) drug therapy: Secondary | ICD-10-CM | POA: Diagnosis not present

## 2019-03-22 DIAGNOSIS — O99519 Diseases of the respiratory system complicating pregnancy, unspecified trimester: Secondary | ICD-10-CM | POA: Diagnosis not present

## 2019-03-22 LAB — URINALYSIS, ROUTINE W REFLEX MICROSCOPIC
Bilirubin Urine: NEGATIVE
Glucose, UA: NEGATIVE mg/dL
Hgb urine dipstick: NEGATIVE
Ketones, ur: 80 mg/dL — AB
Leukocytes,Ua: NEGATIVE
Nitrite: NEGATIVE
Protein, ur: 30 mg/dL — AB
Specific Gravity, Urine: 1.02 (ref 1.005–1.030)
pH: 6 (ref 5.0–8.0)

## 2019-03-22 LAB — CBC WITH DIFFERENTIAL/PLATELET
Abs Immature Granulocytes: 0.11 10*3/uL — ABNORMAL HIGH (ref 0.00–0.07)
Basophils Absolute: 0 10*3/uL (ref 0.0–0.1)
Basophils Relative: 0 %
Eosinophils Absolute: 0 10*3/uL (ref 0.0–0.5)
Eosinophils Relative: 0 %
HCT: 38.5 % (ref 36.0–46.0)
Hemoglobin: 12.3 g/dL (ref 12.0–15.0)
Immature Granulocytes: 1 %
Lymphocytes Relative: 20 %
Lymphs Abs: 1.6 10*3/uL (ref 0.7–4.0)
MCH: 30.2 pg (ref 26.0–34.0)
MCHC: 31.9 g/dL (ref 30.0–36.0)
MCV: 94.6 fL (ref 80.0–100.0)
Monocytes Absolute: 0.6 10*3/uL (ref 0.1–1.0)
Monocytes Relative: 8 %
Neutro Abs: 5.6 10*3/uL (ref 1.7–7.7)
Neutrophils Relative %: 71 %
Platelets: 208 10*3/uL (ref 150–400)
RBC: 4.07 MIL/uL (ref 3.87–5.11)
RDW: 12.5 % (ref 11.5–15.5)
WBC: 8 10*3/uL (ref 4.0–10.5)
nRBC: 0 % (ref 0.0–0.2)

## 2019-03-22 LAB — COMPREHENSIVE METABOLIC PANEL
ALT: 20 U/L (ref 0–44)
AST: 23 U/L (ref 15–41)
Albumin: 3.2 g/dL — ABNORMAL LOW (ref 3.5–5.0)
Alkaline Phosphatase: 78 U/L (ref 38–126)
Anion gap: 12 (ref 5–15)
BUN: <5 mg/dL — ABNORMAL LOW (ref 6–20)
CO2: 20 mmol/L — ABNORMAL LOW (ref 22–32)
Calcium: 8.5 mg/dL — ABNORMAL LOW (ref 8.9–10.3)
Chloride: 104 mmol/L (ref 98–111)
Creatinine, Ser: 0.55 mg/dL (ref 0.44–1.00)
GFR calc Af Amer: >60 mL/min (ref >60)
GFR calc non Af Amer: >60 mL/min (ref >60)
Glucose, Bld: 86 mg/dL (ref 70–99)
Potassium: 3.3 mmol/L — ABNORMAL LOW (ref 3.5–5.1)
Sodium: 136 mmol/L (ref 135–145)
Total Bilirubin: 0.9 mg/dL (ref 0.3–1.2)
Total Protein: 6.6 g/dL (ref 6.5–8.1)

## 2019-03-22 LAB — LITHIUM LEVEL: Lithium Lvl: <0.06 mmol/L — ABNORMAL LOW (ref 0.60–1.20)

## 2019-03-22 MED ORDER — SODIUM CHLORIDE 0.9 % IV BOLUS
1000.0000 mL | Freq: Once | INTRAVENOUS | Status: AC
  Administered 2019-03-22: 1000 mL via INTRAVENOUS

## 2019-03-22 MED ORDER — AEROCHAMBER PLUS FLO-VU LARGE MISC
1.0000 | Freq: Once | Status: AC
  Administered 2019-03-22: 1

## 2019-03-22 MED ORDER — ALBUTEROL SULFATE HFA 108 (90 BASE) MCG/ACT IN AERS
2.0000 | INHALATION_SPRAY | RESPIRATORY_TRACT | Status: DC | PRN
  Administered 2019-03-22: 22:00:00 2 via RESPIRATORY_TRACT
  Filled 2019-03-22: qty 6.7

## 2019-03-22 MED ORDER — ACETAMINOPHEN 500 MG PO TABS
1000.0000 mg | ORAL_TABLET | Freq: Once | ORAL | Status: AC
  Administered 2019-03-22: 1000 mg via ORAL
  Filled 2019-03-22: qty 2

## 2019-03-22 MED ORDER — AEROCHAMBER PLUS FLO-VU LARGE MISC
Status: AC
  Filled 2019-03-22: qty 1

## 2019-03-22 NOTE — Progress Notes (Signed)
Pt is very anxious.  Education provided.  Reassurance given.  Continues to sat 98-99 on room air.  Talking to friends on phone during NST.  NST appropriate for 22 6/7 weeks.  Left message on Dr Drema Halon phone.  Will continue to try and contact MD.

## 2019-03-22 NOTE — ED Provider Notes (Signed)
[facility] Note   CSN: [id number] Arrival date & time: 03/22/19  1456     History   Chief Complaint Chief Complaint  Patient presents with  . Shortness of Breath    HPI Latoya Guerrero is a 33 y.o. female.     HPI Patient presents to the emergency department with shortness of breath over the last few days.  The patient states that she was tested for Covid on August 3 and was positive.  Patient states that she had dry cough and body aches.  Patient states that her symptoms had started July 31.  Patient states that she feels like at times her breathing is heavy.  She states that nothing seems to make the condition better or worse.  The patient denies chest pain headache,blurred vision, neck pain, fever, weakness, numbness, dizziness, anorexia, edema, abdominal pain, nausea, vomiting, diarrhea, rash, back pain, dysuria, hematemesis, bloody stool, near syncope, or syncope. Past Medical History:  Diagnosis Date  . Anxiety   . GERD (gastroesophageal reflux disease)     Patient Active Problem List   Diagnosis Date Noted  . GAD (generalized anxiety disorder) 05/21/2018    Past Surgical History:  Procedure Laterality Date  . ESOPHAGEAL MANOMETRY N/A 10/30/2017   Procedure: ESOPHAGEAL MANOMETRY (EM);  Surgeon: Charlott RakesSchooler, Vincent, MD;  Location: [facility];  Service: Endoscopy;  Laterality: N/A;  . PH IMPEDANCE STUDY N/A 10/30/2017   Procedure: PH IMPEDANCE STUDY;  Surgeon: Charlott RakesSchooler, Vincent, MD;  Location: [facility];  Service: Endoscopy;  Laterality: N/A;  . TONSILLECTOMY    . VAGINAL DELIVERY       OB History    Gravida  4   Para  1   Term  1   Preterm      AB  2   Living  1     SAB      TAB  2   Ectopic  0   Multiple  0   Live Births               Home Medications    Prior to Admission medications   Medication Sig Start Date End Date Taking? Authorizing Provider  acetaminophen (TYLENOL) 500 MG  tablet Take 1,000 mg by mouth every 12 (twelve) hours as needed for mild pain or headache.   Yes [provider]  Ascorbic Acid (VITA-C PO) Take 3 tablets by mouth once. Gummie   Yes [provider]  dextromethorphan-guaiFENesin (MUCINEX DM) 30-600 MG 12hr tablet Take 1 tablet by mouth as needed (congestion).    Yes [provider]  diphenhydrAMINE (BENADRYL) 25 MG tablet Take 25 mg by mouth every 6 (six) hours as needed (Congestions).   Yes [provider]  Fructose-Dextrose-Phosphor Acd (NAUSEA RELIEF PO) Take 30 mLs by mouth as needed (Nausea).   Yes [provider]  Azelastine-Fluticasone (DYMISTA) 137-50 MCG/ACT SUSP Place 2 sprays into both nostrils daily. Patient not taking: Reported on 03/22/2019 08/14/18   Marcelyn BruinsPadgett, Shaylar Patricia, MD  fluticasone [facility](FLONASE) 50 MCG/ACT nasal spray Place 2 sprays into both nostrils at bedtime. Patient not taking: Reported on 03/22/2019 10/19/14   Ozella RocksMerrell, David J, MD    Family History Family History  Problem Relation Age of Onset  . Hypertension Mother   . Cancer Mother        skin    Social History Social History   Tobacco Use  . Smoking status: Never Smoker  . Smokeless tobacco: Never Used  Substance  Use Topics  . Alcohol use: Yes  . Drug use: No    Comment: Pt denied; UDS not available at time of assessment     Allergies   Amoxicillin, Ibuprofen, and Naproxen sodium   Review of Systems Review of Systems All other systems negative except as documented in the HPI. All pertinent positives and negatives as reviewed in the HPI.  Physical Exam Updated Vital Signs BP 110/72   Pulse 95   Temp 99.1 F (37.3 C) (Oral)   Resp 16   Ht 5\' 7"  (1.702 m)   Wt 93.9 kg   SpO2 98%   BMI 32.42 kg/m   Physical Exam Vitals signs and nursing note reviewed.  Constitutional:      General: She is not in acute distress.    Appearance: She is well-developed.  HENT:     Head: Normocephalic and  atraumatic.  Eyes:     Pupils: Pupils are equal, round, and reactive to light.  Neck:     Musculoskeletal: Normal range of motion and neck supple.  Cardiovascular:     Rate and Rhythm: Normal rate and regular rhythm.     Heart sounds: Normal heart sounds. No murmur. No friction rub. No gallop.   Pulmonary:     Effort: Pulmonary effort is normal. No respiratory distress.     Breath sounds: Normal breath sounds. No decreased breath sounds, wheezing, rhonchi or rales.  Abdominal:     General: Bowel sounds are normal. There is no distension.     Palpations: Abdomen is soft.     Tenderness: There is no abdominal tenderness.  Skin:    General: Skin is warm and dry.     Capillary Refill: Capillary refill takes less than 2 seconds.     Findings: No erythema or rash.  Neurological:     Mental Status: She is alert and oriented to person, place, and time.     Motor: No abnormal muscle tone.     Coordination: Coordination normal.  Psychiatric:        Mood and Affect: Mood is anxious.        Behavior: Behavior normal.      ED Treatments / Results  Labs (all labs ordered are listed, but only abnormal results are displayed) Labs Reviewed  CBC WITH DIFFERENTIAL/PLATELET - Abnormal; Notable for the following components:      Result Value   Abs Immature Granulocytes 0.11 (*)    All other components within normal limits  COMPREHENSIVE METABOLIC PANEL - Abnormal; Notable for the following components:   Potassium 3.3 (*)    CO2 20 (*)    BUN <5 (*)    Calcium 8.5 (*)    Albumin 3.2 (*)    All other components within normal limits  URINALYSIS, ROUTINE W REFLEX MICROSCOPIC - Abnormal; Notable for the following components:   Color, Urine AMBER (*)    APPearance HAZY (*)    Ketones, ur 80 (*)    Protein, ur 30 (*)    Bacteria, UA MANY (*)    All other components within normal limits  LITHIUM LEVEL - Abnormal; Notable for the following components:   Lithium Lvl <0.06 (*)    All other  components within normal limits    EKG None  Radiology No results found.  Procedures Procedures (including critical care time)  Medications Ordered in ED Medications  albuterol (VENTOLIN HFA) 108 (90 Base) MCG/ACT inhaler 2 puff (has no administration in time range)  AeroChamber Plus Flo-Vu  Large MISC 1 each (has no administration in time range)  acetaminophen (TYLENOL) tablet 1,000 mg (has no administration in time range)  sodium chloride 0.9 % bolus 1,000 mL (0 mLs Intravenous Stopped 03/22/19 2100)     Initial Impression / Assessment and Plan / ED Course  I have reviewed the triage vital signs and the nursing notes.  Pertinent labs & imaging results that were available during my care of the patient were reviewed by me and considered in my medical decision making (see chart for details).       Patient has been observed over many hours here in the emergency department and was assessed by our rapid response OB nurse.  The patient has had no issues on fetal monitoring here in the emergency department.  Patient's oxygen saturations have stayed between 97 and 100%.  Patient was ambulated about the room and did not show any decrease in her SPO2.  Patient is not showing any signs of labored breathing at this time.  She was given an albuterol inhaler as well.  Patient appears very anxious on my examination.  Final Clinical Impressions(s) / ED Diagnoses   Final diagnoses:  [id number]    ED Discharge Orders    None       Rebeca Allegra 03/22/19 2118    Dorie Rank, MD 03/24/19 [phone]

## 2019-03-22 NOTE — ED Triage Notes (Signed)
Patient arrives c/o shortness of breath over past few days but worsening last night and today. Patient tested positive for covid Thursday. Patient approx 6 months pregnant.

## 2019-03-22 NOTE — ED Notes (Signed)
This tech entered the room to ambulate PT, upon entering PT was complaining of shortness of breath, and a headache also asking for pain medication. RN notified. PT was amble to ambulate in her room with minimal assistance. PT tolerated ambulating well patient was 98% on room air prior to ambulating, 97% while ambulating and returned back to bed on 99%.

## 2019-03-22 NOTE — ED Notes (Signed)
Rapid OB called and states en route.

## 2019-03-22 NOTE — Progress Notes (Signed)
G4P1 at 22 6/7 weeks reports to [facility] with primary complaint of shortness of breath.  Pt tested positive for COVID 19 on Thursday.  Pt reports good fetal movement with no bleeding or leaking. VSS.  Sats 98-99 on room air through a mask.  Sees [facility].  Last PNV was 7/28.  No complications in pregnancy at this point.  No problems noted with previous pregnancy.

## 2019-03-22 NOTE — Discharge Instructions (Addendum)
Return here as needed.  You will need to follow-up with your doctor soon as possible.  Your testing tonight did not show any significant abnormalities.  Take Tylenol for any discomfort.

## 2019-03-22 NOTE — Progress Notes (Signed)
Talked with Dr Mickle Mallory regarding patient.  Updated on pt complaints and NST results.  Pt is cleared from [facility] service.  Keep appt with [facility] scheduled in 4 days.  Office updated on COVID 19 positive status.   Updated Katie ED RN regarding patient's history of bipolar disease with patient self medicating on Lithium and Haldol per Dr Mickle Mallory.  Pt is seeing psychiatry at [facility].

## 2019-03-22 NOTE — ED Notes (Signed)
Patient verbalized understanding of dc instructions, vss. Pt offered wheelchair at discharge but refused.  Ambulatory with nad.

## 2019-04-05 ENCOUNTER — Other Ambulatory Visit: Payer: Self-pay

## 2019-04-05 DIAGNOSIS — Z20822 Contact with and (suspected) exposure to covid-19: Secondary | ICD-10-CM

## 2019-04-05 DIAGNOSIS — R6889 Other general symptoms and signs: Secondary | ICD-10-CM | POA: Diagnosis not present

## 2019-04-06 LAB — NOVEL CORONAVIRUS, NAA: SARS-CoV-2, NAA: NOT DETECTED

## 2019-04-28 DIAGNOSIS — Z23 Encounter for immunization: Secondary | ICD-10-CM | POA: Diagnosis not present

## 2019-04-28 DIAGNOSIS — Z3689 Encounter for other specified antenatal screening: Secondary | ICD-10-CM | POA: Diagnosis not present

## 2019-04-30 DIAGNOSIS — F3110 Bipolar disorder, current episode manic without psychotic features, unspecified: Secondary | ICD-10-CM | POA: Diagnosis not present

## 2019-05-27 DIAGNOSIS — O9229 Other disorders of breast associated with pregnancy and the puerperium: Secondary | ICD-10-CM | POA: Diagnosis not present

## 2019-06-23 DIAGNOSIS — Z3685 Encounter for antenatal screening for Streptococcus B: Secondary | ICD-10-CM | POA: Diagnosis not present

## 2019-06-25 LAB — OB RESULTS CONSOLE GBS: GBS: NEGATIVE

## 2019-07-13 ENCOUNTER — Telehealth (HOSPITAL_COMMUNITY): Payer: Self-pay | Admitting: *Deleted

## 2019-07-13 ENCOUNTER — Encounter (HOSPITAL_COMMUNITY): Payer: Self-pay | Admitting: *Deleted

## 2019-07-13 NOTE — Telephone Encounter (Signed)
Preadmission screen  

## 2019-07-17 ENCOUNTER — Other Ambulatory Visit (HOSPITAL_COMMUNITY)
Admission: RE | Admit: 2019-07-17 | Discharge: 2019-07-17 | Disposition: A | Discharge status: Home / Self Care | Payer: BC Managed Care – PPO | Source: Ambulatory Visit | Attending: Obstetrics and Gynecology | Admitting: Obstetrics and Gynecology

## 2019-07-17 DIAGNOSIS — Z20828 Contact with and (suspected) exposure to other viral communicable diseases: Secondary | ICD-10-CM | POA: Diagnosis not present

## 2019-07-17 DIAGNOSIS — Z01812 Encounter for preprocedural laboratory examination: Secondary | ICD-10-CM | POA: Diagnosis not present

## 2019-07-17 LAB — SARS CORONAVIRUS 2 (TAT 6-24 HRS): SARS Coronavirus 2: NEGATIVE

## 2019-07-18 ENCOUNTER — Other Ambulatory Visit: Payer: Self-pay | Admitting: Obstetrics and Gynecology

## 2019-07-18 NOTE — H&P (Signed)
Latoya Guerrero is a 33 y.o. female presenting for IOL at term. PNC c/b bipolar disorder followed by behavioral health on lamictal 100mg  QD (previously on haldol and lithium). Was COVID pos in August, negative testing this admission. Pelvic proven to [id number] OB History    Gravida  4   Para  1   Term  1   Preterm      AB  2   Living  1     SAB      TAB  2   Ectopic  0   Multiple  0   Live Births             Past Medical History:  Diagnosis Date  . Anxiety   . Bipolar disorder ([place])   . GERD (gastroesophageal reflux disease)   . Hx of gonorrhea    Past Surgical History:  Procedure Laterality Date  . ESOPHAGEAL MANOMETRY N/A 10/30/2017   Procedure: ESOPHAGEAL MANOMETRY (EM);  Surgeon: Wilford Corner, MD;  Location: [facility];  Service: Endoscopy;  Laterality: N/A;  . [place] IMPEDANCE STUDY N/A 10/30/2017   Procedure: [place] IMPEDANCE STUDY;  Surgeon: Wilford Corner, MD;  Location: [facility];  Service: Endoscopy;  Laterality: N/A;  . TONSILLECTOMY    . VAGINAL DELIVERY     Family History: family history includes Cancer in her mother; Hypertension in her mother. Social History:  reports that she has never smoked. She has never used smokeless tobacco. She reports previous alcohol use. She reports that she does not use drugs.     Maternal Diabetes: No 1hr 100 Genetic Screening: Normal Maternal Ultrasounds/Referrals: Normal Fetal Ultrasounds or other Referrals:  None Maternal Substance Abuse:  No Significant Maternal Medications:  Meds include: Other: Lamictal 100mg   Significant Maternal Lab Results:  Group B Strep negative Other Comments:  None  Review of Systems  Constitutional: Negative for chills and fever.  Eyes: Negative for blurred vision.  Respiratory: Negative for shortness of breath.   Cardiovascular: Negative for chest pain, palpitations and leg swelling.  Gastrointestinal: Negative for abdominal pain and vomiting.  Neurological: Negative for  dizziness, weakness and headaches.  Psychiatric/Behavioral: Negative for suicidal ideas.   History   Last menstrual period 10/13/2018. Exam Physical Exam  Constitutional: She is oriented to person, place, and time. She appears well-developed and well-nourished. No distress.  HENT:  Head: Normocephalic and atraumatic.  Eyes: Pupils are equal, round, and reactive to light.  Neck: Normal range of motion. Neck supple.  Cardiovascular: Normal rate and regular rhythm. Exam reveals no gallop.  No murmur heard. GI: There is no abdominal tenderness. There is no rebound and no guarding.  Genitourinary:    Vagina and uterus normal.   Musculoskeletal: Normal range of motion.  Neurological: She is alert and oriented to person, place, and time.  Skin: Skin is warm and dry.    Prenatal labs: ABO, Rh: O/Positive/-- (05/29 0000) Antibody: Negative (05/29 0000) Rubella: Immune (05/29 0000) RPR: Nonreactive (05/29 0000)  HBsAg: Negative (05/29 0000)  HIV: Non-reactive (05/29 0000)  GBS: Negative/-- (11/13 0000)   Assessment/Plan: This is a 33yo [id number] @ 34 6/7 by LMP c/w 12wk scan admitted for IOL at term. PNC c/b BPD on Lamictal. GBS neg  Admit to L&D. Cytotec for cerivcal ripening followed by AROM and pitocin when amenable. Patient may have epidural upon request. CLD, CEFM.   Baby boy no circ paid for   Deliah Boston 07/18/2019, 10:50 AM

## 2019-07-19 ENCOUNTER — Encounter (HOSPITAL_COMMUNITY): Payer: Self-pay | Admitting: *Deleted

## 2019-07-19 ENCOUNTER — Inpatient Hospital Stay (HOSPITAL_COMMUNITY): Payer: BC Managed Care – PPO | Admitting: Anesthesiology

## 2019-07-19 ENCOUNTER — Inpatient Hospital Stay (HOSPITAL_COMMUNITY): Payer: BC Managed Care – PPO

## 2019-07-19 ENCOUNTER — Other Ambulatory Visit: Payer: Self-pay

## 2019-07-19 ENCOUNTER — Inpatient Hospital Stay (HOSPITAL_COMMUNITY)
Admission: AD | Admit: 2019-07-19 | Discharge: 2019-07-21 | DRG: 807 | Disposition: A | Discharge status: Home / Self Care | Payer: BC Managed Care – PPO | Attending: Obstetrics and Gynecology | Admitting: Obstetrics and Gynecology

## 2019-07-19 DIAGNOSIS — Z3A39 39 weeks gestation of pregnancy: Secondary | ICD-10-CM

## 2019-07-19 DIAGNOSIS — O26893 Other specified pregnancy related conditions, third trimester: Secondary | ICD-10-CM | POA: Diagnosis not present

## 2019-07-19 LAB — ABO/RH: ABO/RH(D): O POS

## 2019-07-19 LAB — RPR: RPR Ser Ql: NONREACTIVE

## 2019-07-19 LAB — TYPE AND SCREEN
ABO/RH(D): O POS
Antibody Screen: NEGATIVE

## 2019-07-19 LAB — CBC
HCT: 32.7 % — ABNORMAL LOW (ref 36.0–46.0)
Hemoglobin: 10.4 g/dL — ABNORMAL LOW (ref 12.0–15.0)
MCH: 26.9 pg (ref 26.0–34.0)
MCHC: 31.8 g/dL (ref 30.0–36.0)
MCV: 84.5 fL (ref 80.0–100.0)
Platelets: 247 10*3/uL (ref 150–400)
RBC: 3.87 MIL/uL (ref 3.87–5.11)
RDW: 15.7 % — ABNORMAL HIGH (ref 11.5–15.5)
WBC: 7.7 10*3/uL (ref 4.0–10.5)
nRBC: 1.2 % — ABNORMAL HIGH (ref 0.0–0.2)

## 2019-07-19 MED ORDER — SOD CITRATE-CITRIC ACID 500-334 MG/5ML PO SOLN: 30.0000 mL | ORAL | Status: DC | PRN

## 2019-07-19 MED ORDER — WITCH HAZEL-GLYCERIN EX PADS: 1.0000 "application " | MEDICATED_PAD | CUTANEOUS | Status: DC | PRN

## 2019-07-19 MED ORDER — BUTORPHANOL TARTRATE 1 MG/ML IJ SOLN
1.0000 mg | INTRAMUSCULAR | Status: DC | PRN
  Administered 2019-07-19 (×2): 1 mg via INTRAVENOUS
  Filled 2019-07-19 (×2): qty 1

## 2019-07-19 MED ORDER — EPHEDRINE 5 MG/ML INJ: 10.0000 mg | INTRAVENOUS | Status: DC | PRN

## 2019-07-19 MED ORDER — LACTATED RINGERS IV SOLN
500.0000 mL | INTRAVENOUS | Status: DC | PRN
  Administered 2019-07-19 (×2): 500 mL via INTRAVENOUS

## 2019-07-19 MED ORDER — DIPHENHYDRAMINE HCL 25 MG PO CAPS: 25.0000 mg | ORAL_CAPSULE | Freq: Four times a day (QID) | ORAL | Status: DC | PRN

## 2019-07-19 MED ORDER — OXYCODONE HCL 5 MG PO TABS
5.0000 mg | ORAL_TABLET | Freq: Four times a day (QID) | ORAL | Status: DC | PRN
  Administered 2019-07-19 – 2019-07-20 (×2): 5 mg via ORAL
  Filled 2019-07-19 (×2): qty 1

## 2019-07-19 MED ORDER — OXYCODONE-ACETAMINOPHEN 5-325 MG PO TABS: 1.0000 | ORAL_TABLET | ORAL | Status: DC | PRN

## 2019-07-19 MED ORDER — LORAZEPAM 2 MG/ML IJ SOLN
1.0000 mg | Freq: Once | INTRAMUSCULAR | Status: AC
  Administered 2019-07-19: 1 mg via INTRAVENOUS
  Filled 2019-07-19: qty 1

## 2019-07-19 MED ORDER — TERBUTALINE SULFATE 1 MG/ML IJ SOLN: 0.2500 mg | Freq: Once | INTRAMUSCULAR | Status: DC | PRN

## 2019-07-19 MED ORDER — OXYTOCIN 40 UNITS IN NORMAL SALINE INFUSION - SIMPLE MED
1.0000 m[IU]/min | INTRAVENOUS | Status: DC
  Administered 2019-07-19: 2 m[IU]/min via INTRAVENOUS
  Filled 2019-07-19: qty 1000

## 2019-07-19 MED ORDER — ACETAMINOPHEN 325 MG PO TABS: 650.0000 mg | ORAL_TABLET | ORAL | Status: DC | PRN

## 2019-07-19 MED ORDER — FLEET ENEMA 7-19 GM/118ML RE ENEM: 1.0000 | ENEMA | RECTAL | Status: DC | PRN

## 2019-07-19 MED ORDER — FENTANYL-BUPIVACAINE-NACL 0.5-0.125-0.9 MG/250ML-% EP SOLN: 12.0000 mL/h | EPIDURAL | Status: DC | PRN

## 2019-07-19 MED ORDER — LAMOTRIGINE 100 MG PO TABS
100.0000 mg | ORAL_TABLET | Freq: Every day | ORAL | Status: DC
  Administered 2019-07-19 – 2019-07-20 (×2): 100 mg via ORAL
  Filled 2019-07-19 (×3): qty 1

## 2019-07-19 MED ORDER — ONDANSETRON HCL 4 MG/2ML IJ SOLN
4.0000 mg | Freq: Four times a day (QID) | INTRAMUSCULAR | Status: DC | PRN
  Administered 2019-07-19: 4 mg via INTRAVENOUS
  Filled 2019-07-19: qty 2

## 2019-07-19 MED ORDER — OXYTOCIN 40 UNITS IN NORMAL SALINE INFUSION - SIMPLE MED
2.5000 [IU]/h | INTRAVENOUS | Status: DC
  Administered 2019-07-19 (×2): 2.5 [IU]/h via INTRAVENOUS

## 2019-07-19 MED ORDER — DIPHENHYDRAMINE HCL 50 MG/ML IJ SOLN: 12.5000 mg | INTRAMUSCULAR | Status: DC | PRN

## 2019-07-19 MED ORDER — BENZOCAINE-MENTHOL 20-0.5 % EX AERO
1.0000 "application " | INHALATION_SPRAY | CUTANEOUS | Status: DC | PRN
  Administered 2019-07-19 – 2019-07-21 (×2): 1 via TOPICAL
  Filled 2019-07-19 (×2): qty 56

## 2019-07-19 MED ORDER — FENTANYL-BUPIVACAINE-NACL 0.5-0.125-0.9 MG/250ML-% EP SOLN
EPIDURAL | Status: AC
  Filled 2019-07-19: qty 250

## 2019-07-19 MED ORDER — PHENYLEPHRINE 40 MCG/ML (10ML) SYRINGE FOR IV PUSH (FOR BLOOD PRESSURE SUPPORT): 80.0000 ug | PREFILLED_SYRINGE | INTRAVENOUS | Status: DC | PRN

## 2019-07-19 MED ORDER — ACETAMINOPHEN 500 MG PO TABS
1000.0000 mg | ORAL_TABLET | Freq: Three times a day (TID) | ORAL | Status: DC
  Administered 2019-07-19 – 2019-07-21 (×5): 1000 mg via ORAL
  Filled 2019-07-19 (×5): qty 2

## 2019-07-19 MED ORDER — LACTATED RINGERS IV SOLN: 500.0000 mL | Freq: Once | INTRAVENOUS | Status: DC

## 2019-07-19 MED ORDER — OXYTOCIN BOLUS FROM INFUSION
500.0000 mL | Freq: Once | INTRAVENOUS | Status: AC
  Administered 2019-07-19: 500 mL via INTRAVENOUS

## 2019-07-19 MED ORDER — DIBUCAINE (PERIANAL) 1 % EX OINT
1.0000 "application " | TOPICAL_OINTMENT | CUTANEOUS | Status: DC | PRN
  Administered 2019-07-19 – 2019-07-21 (×2): 1 via RECTAL
  Filled 2019-07-19 (×2): qty 28

## 2019-07-19 MED ORDER — MISOPROSTOL 25 MCG QUARTER TABLET
25.0000 ug | ORAL_TABLET | ORAL | Status: DC | PRN
  Administered 2019-07-19 (×3): 25 ug via VAGINAL
  Filled 2019-07-19 (×3): qty 1

## 2019-07-19 MED ORDER — OXYCODONE-ACETAMINOPHEN 5-325 MG PO TABS: 2.0000 | ORAL_TABLET | ORAL | Status: DC | PRN

## 2019-07-19 MED ORDER — LACTATED RINGERS IV SOLN
INTRAVENOUS | Status: DC
  Administered 2019-07-19 (×2): via INTRAVENOUS

## 2019-07-19 MED ORDER — SENNOSIDES-DOCUSATE SODIUM 8.6-50 MG PO TABS
2.0000 | ORAL_TABLET | ORAL | Status: DC
  Administered 2019-07-19 – 2019-07-20 (×2): 2 via ORAL
  Filled 2019-07-19 (×2): qty 2

## 2019-07-19 MED ORDER — SIMETHICONE 80 MG PO CHEW: 80.0000 mg | CHEWABLE_TABLET | ORAL | Status: DC | PRN

## 2019-07-19 MED ORDER — LORAZEPAM 2 MG/ML IJ SOLN: 1.0000 mg | INTRAMUSCULAR | Status: DC | PRN

## 2019-07-19 MED ORDER — ONDANSETRON HCL 4 MG/2ML IJ SOLN: 4.0000 mg | INTRAMUSCULAR | Status: DC | PRN

## 2019-07-19 MED ORDER — ONDANSETRON HCL 4 MG PO TABS
4.0000 mg | ORAL_TABLET | ORAL | Status: DC | PRN
  Administered 2019-07-19 – 2019-07-21 (×6): 4 mg via ORAL
  Filled 2019-07-19 (×6): qty 1

## 2019-07-19 MED ORDER — PRENATAL MULTIVITAMIN CH
1.0000 | ORAL_TABLET | Freq: Every day | ORAL | Status: DC
  Administered 2019-07-20: 1 via ORAL
  Filled 2019-07-19: qty 1

## 2019-07-19 MED ORDER — COCONUT OIL OIL: 1.0000 "application " | TOPICAL_OIL | Status: DC | PRN

## 2019-07-19 MED ORDER — SODIUM CHLORIDE (PF) 0.9 % IJ SOLN
INTRAMUSCULAR | Status: DC | PRN
  Administered 2019-07-19: 12 mL/h via EPIDURAL

## 2019-07-19 MED ORDER — TETANUS-DIPHTH-ACELL PERTUSSIS 5-2.5-18.5 LF-MCG/0.5 IM SUSP: 0.5000 mL | Freq: Once | INTRAMUSCULAR | Status: DC

## 2019-07-19 MED ORDER — ZOLPIDEM TARTRATE 5 MG PO TABS: 5.0000 mg | ORAL_TABLET | Freq: Every evening | ORAL | Status: DC | PRN

## 2019-07-19 MED ORDER — LIDOCAINE HCL (PF) 1 % IJ SOLN: 30.0000 mL | INTRAMUSCULAR | Status: DC | PRN

## 2019-07-19 MED ORDER — LIDOCAINE HCL (PF) 1 % IJ SOLN
INTRAMUSCULAR | Status: DC | PRN
  Administered 2019-07-19 (×2): 5 mL via EPIDURAL

## 2019-07-19 NOTE — Anesthesia Procedure Notes (Signed)
Epidural Patient location during procedure: OB  Staffing Anesthesiologist: Dorse Locy, MD Performed: anesthesiologist   Preanesthetic Checklist Completed: patient identified, site marked, surgical consent, pre-op evaluation, timeout performed, IV checked, risks and benefits discussed and monitors and equipment checked  Epidural Patient position: sitting Prep: DuraPrep Patient monitoring: heart rate, continuous pulse ox and blood pressure Approach: midline Location: L3-L4 Injection technique: LOR saline  Needle:  Needle type: Tuohy  Needle gauge: 17 G Needle length: 9 cm and 9 Needle insertion depth: 6 cm Catheter type: closed end flexible Catheter size: 20 Guage Catheter at skin depth: 10 cm Test dose: negative  Assessment Events: blood not aspirated, injection not painful, no injection resistance, negative IV test and no paresthesia  Additional Notes Patient identified. Risks/Benefits/Options discussed with patient including but not limited to bleeding, infection, nerve damage, paralysis, failed block, incomplete pain control, headache, blood pressure changes, nausea, vomiting, reactions to medication both or allergic, itching and postpartum back pain. Confirmed with bedside nurse the patient's most recent platelet count. Confirmed with patient that they are not currently taking any anticoagulation, have any bleeding history or any family history of bleeding disorders. Patient expressed understanding and wished to proceed. All questions were answered. Sterile technique was used throughout the entire procedure. Please see nursing notes for vital signs. Test dose was given through epidural needle and negative prior to continuing to dose epidural or start infusion. Warning signs of high block given to the patient including shortness of breath, tingling/numbness in hands, complete motor block, or any concerning symptoms with instructions to call for help. Patient was given  instructions on fall risk and not to get out of bed. All questions and concerns addressed with instructions to call with any issues.     

## 2019-07-19 NOTE — Progress Notes (Signed)
Notified by RN regarding FHR decel at [place], CE unchanged, improved with repositioning, patient currently left lateral. CTX irr q1-4, TOCO not tracing well but palpable at bedside. Pt requesting epidural at this time. Reviewed with patient that this would be used in case of an urgent or stat c-section which is beneficial to avoid intubation/general anesthesia fi none were placed. Patient understands she is 1cm and remote from delivery. Questions anxiety med. Lamictal 100mg  QHS ordered plus one time IV ativan 1mg 

## 2019-07-19 NOTE — Anesthesia Preprocedure Evaluation (Signed)

## 2019-07-19 NOTE — Progress Notes (Addendum)
Presented to bedside for repeat C.e Still 1/60/-3, soft, midposition. Patient pain improving s/p epidural however still quite uncomfortable, may not be good FB candidate.   FHR tracing category 2 with min var, no accels ro decels, baseline 130. + accel >15x15 with fetal scalp stim. Tracing now with early decelerations, min to mod var. Cat 1-2.  Reviewed with patient attempt to retrial PV cytotec dose. Given prior deceleration, also reviewed possibility of repeat decel with unchanged CE. If this is the case, would recommend PLTCS for fetal intolerance to labor. R/B/A of cesarean section discussed with patient. Alternative would be vaginal delivery which would mean shorter postpartum stay and decreased risk of bleeding. Risks of section include infection of the uterus, pelvic organs, or skin, inadvertent injury to internal organs, such as bowel or bladder. If there is major injury, extensive surgery may be required. If injury is minor, it may be treated with relative ease. Discussed possibility of excessive blood loss and transfusion. If bleeding cannot be controlled using medical or minor surgical methods, a cesarean hysterectomy may be performed which would mean no future fertility. Patient accepts the possibility of blood transfusion, if necessary. Patient understands and agrees to move forward if need be

## 2019-07-19 NOTE — Progress Notes (Signed)
Labor Note  S: Feeling painful contractions, minor improvement with stadol  O: BP 112/85   Pulse 83   Temp 98.1 F (36.7 C) (Oral)   Resp 14   Ht 5\' 7"  (1.702 m)   Wt 100.1 kg   LMP 10/13/2018   BMI 34.57 kg/m  CE: 1/50/-3 @ 0520 FHR: Baseline 125, +accels, occ early decels w/ one variable, min ot mod variability TOCO q2-7, s/p PV cytotec 0520, 0104  A/P: This is a 33 y.o. [id number] at [redacted]w[redacted]d  admitted for IOL at term. PNC c/b BPD on lacmital. GBS neg, EFW 3400g FWB: Cat 1-2 tracing, some occ variable decels MWB: Painfully cramping, desires epidural but wants to shower first Labor course: Undergoing cervical ripening. Next Ce approx 0900, pending rpt cytotec vs initiation of pitocin. AROM when amenable  Anticipate SVD

## 2019-07-19 NOTE — Progress Notes (Signed)
Labor Note  S: Comfortable s/p epidural, lying left lateral, denies VB< LOF. Occ pressure.  Per RN prolonged late decel at 1444, SRTB with repositioning  O: BP (!) 107/93   Pulse 77   Temp 98.2 F (36.8 C) (Axillary)   Resp 17   Ht 5\' 7"  (1.702 m)   Wt 100.1 kg   LMP 10/13/2018   SpO2 100%   BMI 34.57 kg/m  CE: AROM clear 1530, CE 1/80/-3 FHR: Baseline 135, +accels, no decels, mod var TOCO triplets, very irr  A/P: This is a 32 y.o. [id number] at [redacted]w[redacted]d  admitted for IOL at term. PNC c/b BPD on lacmital. GBS neg, EFW 3400g FWB: Currently cat 1, improved from earlier decel MWB: Comfortable s/p epidural and AROM Labor course: Dicsussed with patient methods of further ripening including continued cytotec vs Foley. Also may augment with AROM and proceed with pitocin. Patient declines balloon and elects to move forward with AROM as stated above. Fetal occiput well-applied.  Will initiate pitocin at this time. Same conversation repeated about possible PLTCS for fetal intolerance to labor should FHR decelerations (prolonged) occur again remote from delivery  Anticipate SVD

## 2019-07-20 ENCOUNTER — Encounter (HOSPITAL_COMMUNITY): Payer: Self-pay | Admitting: *Deleted

## 2019-07-20 LAB — CBC
HCT: 30.5 % — ABNORMAL LOW (ref 36.0–46.0)
Hemoglobin: 9.3 g/dL — ABNORMAL LOW (ref 12.0–15.0)
MCH: 26.3 pg (ref 26.0–34.0)
MCHC: 30.5 g/dL (ref 30.0–36.0)
MCV: 86.4 fL (ref 80.0–100.0)
Platelets: 205 10*3/uL (ref 150–400)
RBC: 3.53 MIL/uL — ABNORMAL LOW (ref 3.87–5.11)
RDW: 15.9 % — ABNORMAL HIGH (ref 11.5–15.5)
WBC: 10.3 10*3/uL (ref 4.0–10.5)
nRBC: 0.2 % (ref 0.0–0.2)

## 2019-07-20 MED ORDER — METHYLERGONOVINE MALEATE 0.2 MG/ML IJ SOLN
0.2000 mg | Freq: Once | INTRAMUSCULAR | Status: AC
  Administered 2019-07-20: 0.2 mg via INTRAMUSCULAR
  Filled 2019-07-20: qty 1

## 2019-07-20 MED ORDER — OXYCODONE HCL 5 MG PO TABS
10.0000 mg | ORAL_TABLET | ORAL | Status: DC | PRN
  Administered 2019-07-20 – 2019-07-21 (×6): 10 mg via ORAL
  Filled 2019-07-20 (×6): qty 2

## 2019-07-20 MED ORDER — SALINE SPRAY 0.65 % NA SOLN
2.0000 | NASAL | Status: DC | PRN
  Administered 2019-07-20: 2 via NASAL
  Filled 2019-07-20: qty 44

## 2019-07-20 NOTE — Progress Notes (Addendum)
Post Partum Day 1 Subjective: Pt c/o cramping and pain not controlled by tylenol and oxycodone 5mg  so would like to increase that if she can.  She also reports takes ibuprofen at home.  Bleeding improved this AM and Hgb stable.   Pt very anxious and knows she may need medications adjusted postpartum, SW is going to help find a new psychiatrist.   She is planning to breastfeed  Objective: Blood pressure 117/86, pulse 78, temperature 98.2 F (36.8 C), temperature source Oral, resp. rate 16, height 5\' 7"  (1.702 m), weight 100.1 kg, last menstrual period 10/13/2018, SpO2 100 %.  Physical Exam:  General: alert and cooperative Lochia: appropriate Uterine Fundus: firm  Recent Labs    07/19/19 0037 07/20/19 0501  HGB 10.4* 9.3*  HCT 32.7* 30.5*    Assessment/Plan: Plan for discharge tomorrow  Will increase oxycodone to 10mg  but d/w pt may need to add back ibuprofen if having to take that too often since she probably does not have an allergy Saline nasal spray for congestion Continue lamictal and adjust meds as needed for anxiety, will see how day goes Has paid for circumcision in office   LOS: 1 day   Logan Bores 07/20/2019, 10:29 AM

## 2019-07-20 NOTE — Lactation Note (Signed)
This note was copied from a baby's chart. Lactation Consultation Note  Patient Name: Latoya Guerrero IDCVU'[id number] Date: 07/20/2019 Reason for consult: Follow-up assessment  LC Follow Up Visit:  Attempted to visit with mother, however, she had a "Do Not Disturb" sign on her door.  LC to return later today.    Consult Status Consult Status: Follow-up Date: 07/20/19 Follow-up type: In-patient    Latoya Guerrero 07/20/2019, 12:31 PM

## 2019-07-20 NOTE — Clinical Social Work Maternal (Signed)
CLINICAL SOCIAL WORK MATERNAL/CHILD NOTE  Patient Details  Name: Latoya Guerrero MRN: [id number] Date of Birth: [birth year]  Date:  07/20/2019  Clinical Social Worker Initiating Note:  Elijio Miles Date/Time: Initiated:  07/20/19/0906     Child's Name:  Latoya Guerrero   Biological Parents:  Mother(Latoya Guerrero ([name] did not want to provide information of FOB))   Need for Interpreter:  None   Reason for Referral:  Behavioral Health Concerns   Address:  [place] [address]    Phone number:  [phone] (home)     Additional phone number:   Household Members/Support Persons (HM/SP):   Household Member/Support Person 1   HM/SP Name Relationship DOB or Age  HM/SP -1 Latoya Guerrero Daughter 04/26/2009  HM/SP -2        HM/SP -3        HM/SP -4        HM/SP -5        HM/SP -6        HM/SP -7        HM/SP -8          Natural Supports (not living in the home):  Friends, Immediate Family, Extended Family, Parent   Professional Supports: Other (Comment)(Psychiatrist through [employer])   Employment: Animator   Type of Work: Pharmacist, hospital at [employer]   Education:  Production designer, theatre/television/film   Homebound arranged:    Pensions consultant:  Multimedia programmer, Medicaid   Other Resources:  [facility]   Cultural/Religious Considerations Which May Impact Care:    Strengths:  Ability to meet basic needs , Home prepared for child , Psychotropic Medications   Psychotropic Medications:  Lamictal      Pediatrician:       Pediatrician List:   [place]      Pediatrician Fax Number:    Risk Factors/Current Problems:  Mental Health Concerns    Cognitive State:  Able to Concentrate , Alert , Linear Thinking    Mood/Affect:  Bright , Calm , Interested , Animated   CSW Assessment:  CSW received consult for history of anxiety and  bipolar disorder.  CSW met with [name] to offer support and complete assessment.    [name] sitting on side of bed eating breakfast with support person at bedside and infant asleep in bassinet, when CSW entered the room. CSW introduced self and explained reason for consult to which [name] expressed understanding. CSW informed that support person is [name]'s best friend. CSW inquired about how [name] had been doing and [name] shared she has had two panic attacks since being at the hospital. [name] reported the epidural scared her and then [name] had an episode last night of excessive bleeding. [name] stated her bleeding has calmed down this morning but [name] reported being very tired and not having been able to sleep thus far. [name] and [name]'s best friend very pleasant and engaged throughout assessment. [name]'s best friend observed to be a very good support for [name]. [name] reported she currently lives with her 18 year old daughter in [place] and works full-time as a Pharmacist, hospital for [employer]. [name] stated she currently receives [facility] and is in the process of applying for food stamps. Per [name], she has an appointment with [facility] on Friday.   CSW inquired about [name]'s mental health history and [name] acknowledged a history of  anxiety, depression and bipolar disorder. [name] shared she is currently followed by a psychiatrist through [facility] and is on Lamictal. Prior to pregnancy [name] was taking lithium and haldol but reported not liking those medications. [name]'s best friend shared she does not believe bipolar is an appropriate diagnosis as she feels it is mainly [name]'s unregulated anxiety that triggers her states of mania. [name] stated she was diagnosed by her psychiatrist and he reported it was a mild form of bipolar. [name] expressed interest in resources for psychiatrists that also have a therapy option within their practice. CSW to provide list for [name]. [name] acknowledged a history of PPD/PPA following her previous pregnancy but stated she didn't know  what it was at the time. CSW provided education regarding the baby blues period vs. perinatal mood disorders, discussed treatment and gave resources for mental health follow up if concerns arise.  CSW recommends self-evaluation during the postpartum time period using the New Mom Checklist from Postpartum Progress and encouraged [name] to contact a medical professional if symptoms are noted at any time. [name] did not appear to be displaying any acute mental health symptoms and denied any current SI, HI or DV. [name] reported having a good support system consisting of her godfather, mom, sister, cousin and best friend.  [name] reported having all essential items for infant once discharged and reported infant would be sleeping in a bassinet once home. CSW provided review of Sudden Infant Death Syndrome (SIDS) precautions.    CSW Plan/Description:  No Further Intervention Required/No Barriers to Discharge, Sudden Infant Death Syndrome (SIDS) Education, Perinatal Mood and Anxiety Disorder (PMADs) Education, Other Information/Referral to [employer], [place] 07/20/2019, 10:18 AM

## 2019-07-20 NOTE — Anesthesia Postprocedure Evaluation (Signed)
Anesthesia Post Note  Patient: Latoya Guerrero  Procedure(s) Performed: AN AD [place]     Patient location during evaluation: Mother Baby Anesthesia Type: Epidural Level of consciousness: awake and alert Pain management: pain level controlled Vital Signs Assessment: post-procedure vital signs reviewed and stable Respiratory status: spontaneous breathing, nonlabored ventilation and respiratory function stable Cardiovascular status: stable Postop Assessment: no headache, no backache and epidural receding Anesthetic complications: no    Last Vitals:  Vitals:   07/20/19 0121 07/20/19 0516  BP: 139/80 117/86  Pulse: 79 78  Resp: 18 16  Temp: 36.8 C 36.8 C  SpO2: 100% 100%    Last Pain:  Vitals:   07/20/19 0517  TempSrc:   PainSc: 7    Pain Goal:                   [employer]

## 2019-07-20 NOTE — Lactation Note (Signed)
This note was copied from a baby's chart. Lactation Consultation Note  Patient Name: Latoya Guerrero [id number] Date: 07/20/2019 Reason for consult: Follow-up assessment;Term;1st time breastfeeding  P2 mother whose infant is now 55 hours old.  Mother did not breast feed her first child.  Mother's feeding preference on admission was breast/bottle.  RN in room with mother when I arrived.  Baby had recently received his bath.  Mother was ready to attempt breast feeding for the first time since delivery.  Offered to assist with latching and mother accepted.  Mother is anxious about breast feeding but desires to try.  Provided some basic breast feeding information before we began to latch.  Discussed the importance of STS and observing for feeding cues.  Mother interested in trying the cross cradle position.  Explained the importance of getting mother situated and comfortable prior to latching.  Positioned pillows appropriately.  Had mother demonstrate hand expression.  Reviewed hand expression to help mother learn how to properly place her hands/fingers.  She was unable to express colostrum until she reviewed her technique.  A few colostrum drops were noted which I finger fed back to baby.  Mother required review each time she tried to hand express.  Explained that she will perfect this skill the more she tries it.  Continued to encourage mother and praise her efforts.    Verbalized and demonstrated correct hand positioning for latching in the cross cradle hold.  Assisted baby to latch after a couple of attempts.  He was still sleepy from his bath and needed gentle stimulation to begin sucking.  Continued to discuss breast feeding basics while working with latching and sucking.  Mother tensed up immediately when I put baby to the breast.  Relaxation and calm talking helped ease her.  She complained of it "hurting" and I did a gentle chin tug which helped.  However, mother remained anxious about breast  feeding.  Continued working with baby for approximately 20 min on/off.  He did have a wide gape and flanged lips which I pointed out to mother.  Demonstrated breast compressions and baby's calm body position at the breast.  With constant stimulation he would suck.  When he became too sleepy to continue I showed mother how to break the suction.  She wanted to sleep stating she had not slept at all.  Acknowledged her request and showed her and her support person how to swaddle baby.  Placed him in the bassinet next to mother.  Discussed pacifier usage since mother has a pacifier in the room.  Mother will continue to feed 8-12 times/24 hours or sooner if he shows feeding cues  She will call for latch assistance as needed.  I suggested other alternatives to using an artificial nipple if mother would prefer not to use one.  She has not completely decided but will see how the next feeding attempt goes and will call for assistance as needed.  Support person very helpful and asked appropriate questions.    Mother has a DEBP for home use.  RN updated.   Maternal Data Formula Feeding for Exclusion: Yes Reason for exclusion: Mother's choice to formula and breast feed on admission Has patient been taught Hand Expression?: Yes Does the patient have breastfeeding experience prior to this delivery?: No  Feeding Feeding Type: Breast Fed  LATCH Score Latch: Repeated attempts needed to sustain latch, nipple held in mouth throughout feeding, stimulation needed to elicit sucking reflex.  Audible Swallowing: None  Type of  Nipple: Everted at rest and after stimulation(short shafted)  Comfort (Breast/Nipple): Soft / non-tender  Hold (Positioning): Assistance needed to correctly position infant at breast and maintain latch.  LATCH Score: 6  Interventions Interventions: Breast feeding basics reviewed;Assisted with latch;Skin to skin;Breast massage;Hand express;Breast compression;Adjust position;Hand  pump;Shells;Position options;Support pillows  Lactation Tools Discussed/Used Tools: Pump;Shells Shell Type: Inverted Breast pump type: Manual WIC Program: Yes   Consult Status Consult Status: Follow-up Date: 07/21/19 Follow-up type: In-patient    Dora Sims 07/20/2019, 4:36 PM

## 2019-07-20 NOTE — Progress Notes (Signed)
Initial visit with Felicity, baby Kash, and her support person, her best friend.  Neela shared she has a 33 year old daughter named Forensic psychologist, but in many ways, this feels like her first time parenting a new baby.  When she had Serenity her baby's father's family and her family rallied around her to ensure that she was able to complete college.  She stated that she only had to care for Serenity every few weeks or so and really didn't have full responsibility for her until she was 2.  She did successfully complete college and another couple of degrees as well and is currently working as an Tourist information centre manager in [place].  I provided space for her to express her concerns and encouraged her to remember what she had accomplished and what she already knows reminding her that no woman is a perfect mother and very few of the choices and mistakes we make have life long consequences, so we just do the best we can with the information and resources available to [place].  Kady shared that she has significant family stress.  In many ways, she was a parentified child and continues to function in this way in relation to her mother.  This has only grown worse since her mother had a medical scare several years ago and now continues to have what Ashanta views are unnecessary medical complications.  Mariaelena acknowledges that she struggles to put up boundaries with her mother because she feels guilty and because her mother withholds affection and connection as a result.  I encouraged her to see that boundaries can be healthy ways for individuals to protect themselves and don't necessarily have to be harsh as long as they're clear and communicated directly.  We role played some boundary conversations and I reinforced her own internal knowledge.  She expressed some embarrassment regarding her psychosocial history and I assured her that the birth of a child is a normal time for one to examine their role in their family system and chose to change it for the  good of their new family unit.  We discussed how to find a good counselor and begin to build rapport, local support available to her including [place] support groups and spiritual care services, and finally reviewed common indicators of PMADs particularly intrusive thoughts.  Radonna shared that she is currently between psychiatrists, but Dr Marvel Plan said that she can help her in the mean time and I encouraged her to reach out before her 6 week check up if necessary. Pt also expressed that she often copes with stress through embodied prayer including crying in the shower and her sister's feelings that Christel does not have enough faith.  I encouraged Marchetta to consider several Biblical stories in which individual's grew closer to God at their breaking point when they couldn't manage everything on their own and assured her that I view her prayer life as strong rather than weak.  Many individuals find prayer and crying in the shower to be cleansing and renewing, but she is aware that if she finds herself crying more and more frequently, she should reach out to her physician.  Please page as further needs arise.  Donald Prose. Elyn Peers, M.Div. [facility] Chaplain Pager [phone] Office [phone]

## 2019-07-20 NOTE — Lactation Note (Signed)
This note was copied from a baby's chart. Lactation Consultation Note  Patient Name: Latoya Guerrero [id number] Date: 07/20/2019 Reason for consult: Initial assessment;1st time breastfeeding;Term P2, 8 hour female infant. Mom with a hx: anxiety and bi-polar , Per RN mom,  been switch from Lithium and Haldol which are L3 medications to Lamictal 100 mg which is L2 -safe with breastfeeding. Per mom, she did not breastfed her 1st child. Mom's feeding choice is breast and formula feeding. Tools given: mom was given breast shells and hand pump due to being short shafted. Mom understands to use hand pump and pre-pump prior to latching infant at breast. LC entered room, mom was having cramping and wants to work on latching infant at breast in the morning she will formula feed infant tonight. Per mom, she had DEBP at home. Mom is active on the [facility] program in [place]. Mom taught back hand expression and colostrum is present in breast. Mom understands to breastfeed infant according to hunger cues, 8 to 12 times within 24 hours and on demand. Mom will continue to do as much STS with infant as possible. Mom knows to call [place] if she changes her mind and decides to latch infant at breast tonight. Mom shown how to use hand pump & how to disassemble, clean, & reassemble parts. Mom made aware of O/P services, breastfeeding support groups, community resources, and our phone # for post-discharge questions.    Maternal Data Formula Feeding for Exclusion: Yes Reason for exclusion: Mother's choice to formula and breast feed on admission Has patient been taught Hand Expression?: Yes(Mom taught back hand expression and colostrum present.) Does the patient have breastfeeding experience prior to this delivery?: No  Feeding Feeding Type: Bottle Fed - Formula  LATCH Score                   Interventions Interventions: Breast feeding basics reviewed;Hand pump;Shells;Hand express  Lactation  Tools Discussed/Used WIC Program: Yes Pump Review: Setup, frequency, and cleaning;Milk Storage Initiated by:: Vicente Serene, IBCLC Date initiated:: 07/20/19   Consult Status Consult Status: Follow-up Date: 07/20/19 Follow-up type: In-patient    Vicente Serene 07/20/2019, 1:15 AM

## 2019-07-21 MED ORDER — ONDANSETRON HCL 4 MG PO TABS: 4.0000 mg | ORAL_TABLET | ORAL | 0 refills | Status: AC | PRN

## 2019-07-21 MED ORDER — OXYCODONE HCL 5 MG PO TABS: ORAL_TABLET | ORAL | 0 refills | Status: AC

## 2019-07-21 MED ORDER — IBUPROFEN 600 MG PO TABS: 600.0000 mg | ORAL_TABLET | Freq: Four times a day (QID) | ORAL | Status: DC | PRN

## 2019-07-21 MED ORDER — IBUPROFEN 600 MG PO TABS: 600.0000 mg | ORAL_TABLET | Freq: Four times a day (QID) | ORAL | 1 refills | Status: AC | PRN

## 2019-07-21 MED ORDER — PRENATAL MULTIVITAMIN CH: 1.0000 | ORAL_TABLET | Freq: Every day | ORAL | 3 refills | Status: AC

## 2019-07-21 NOTE — Discharge Summary (Signed)
OB Discharge Summary     Patient Name: Latoya Guerrero DOB: Dec 27, 1985 MRN: [id number]  Date of admission: 07/19/2019 Delivering MD: Ellison Hughs M   Date of discharge: 07/21/2019  Admitting diagnosis: Pregnancy Intrauterine pregnancy: [redacted]w[redacted]d     Secondary diagnosis:  Active Problems:   [redacted] weeks gestation of pregnancy  Additional problems: Bipolar     Discharge diagnosis: Term Pregnancy Delivered                                                                                                Post partum procedures:N/A  Augmentation: AROM, Pitocin and Cytotec  Complications: None  Hospital course:  Induction of Labor With Vaginal Delivery   33 y.o. yo [id number] at [redacted]w[redacted]d was admitted to the hospital 07/19/2019 for induction of labor.  Indication for induction: elective induction at term.  Patient had an uncomplicated labor course as follows: Membrane Rupture Time/Date: 3:35 PM ,07/19/2019   Intrapartum Procedures: Episiotomy: None [1]                                         Lacerations:  2nd degree [3]  Patient had delivery of a Viable infant.  Information for the patient's newborn:  Micaylah, Bertucci [[id number]]  Delivery Method: Vag-Spont    07/19/2019  Details of delivery can be found in separate delivery note.  Patient had a routine postpartum course. Patient is discharged home 07/21/19.  Physical exam  Vitals:   07/20/19 0516 07/20/19 1535 07/20/19 2109 07/21/19 0531  BP: 117/86 117/74 123/85 130/85  Pulse: 78 92 83 85  Resp: 16 18 16 18   Temp: 98.2 F (36.8 C) 98.5 F (36.9 C) 98.4 F (36.9 C) 98.2 F (36.8 C)  TempSrc: Oral Oral Oral Oral  SpO2: 100% 98% 100% 100%  Weight:      Height:       General: alert and no distress Lochia: appropriate Uterine Fundus: firm  Labs: Lab Results  Component Value Date   WBC 10.3 07/20/2019   HGB 9.3 (L) 07/20/2019   HCT 30.5 (L) 07/20/2019   MCV 86.4 07/20/2019   PLT 205 07/20/2019   CMP Latest Ref Rng & Units  03/22/2019  Glucose 70 - 99 mg/dL 86  BUN 6 - 20 mg/dL 05/22/2019)  Creatinine <3(K - 1.00 mg/dL 7.42  Sodium 5.95 - 638 mmol/L 136  Potassium 3.5 - 5.1 mmol/L 3.3(L)  Chloride 98 - 111 mmol/L 104  CO2 22 - 32 mmol/L 20(L)  Calcium 8.9 - 10.3 mg/dL 756)  Total Protein 6.5 - 8.1 g/dL 6.6  Total Bilirubin 0.3 - 1.2 mg/dL 0.9  Alkaline Phos 38 - 126 U/L 78  AST 15 - 41 U/L 23  ALT 0 - 44 U/L 20    Discharge instruction: per After Visit Summary and "Baby and Me Booklet".  After visit meds:  Allergies as of 07/21/2019      Reactions   Amoxicillin Hives, Rash   Has patient had a PCN reaction causing immediate  rash, facial/tongue/throat swelling, SOB or lightheadedness with hypotension: Yes Has patient had a PCN reaction causing severe rash involving mucus membranes or skin necrosis: No Has patient had a PCN reaction that required hospitalization: No Has patient had a PCN reaction occurring within the last 10 years: Yes If all of the above answers are "NO", then may proceed with Cephalosporin use.   Ibuprofen Hives, Rash   Naproxen Sodium Hives, Rash      Medication List    STOP taking these medications   Azelastine-Fluticasone 137-50 MCG/ACT Susp Commonly known as: Dymista   fluticasone 50 MCG/ACT nasal spray Commonly known as: Flonase     TAKE these medications   acetaminophen 500 MG tablet Commonly known as: TYLENOL Take 1,000 mg by mouth every 12 (twelve) hours as needed for mild pain or headache.   dextromethorphan-guaiFENesin 30-600 MG 12hr tablet Commonly known as: MUCINEX DM Take 1 tablet by mouth as needed (congestion).   diphenhydrAMINE 25 MG tablet Commonly known as: BENADRYL Take 25 mg by mouth every 6 (six) hours as needed (Congestions).   ibuprofen 600 MG tablet Commonly known as: ADVIL Take 1 tablet (600 mg total) by mouth every 6 (six) hours as needed for moderate pain.   NAUSEA RELIEF PO Take 30 mLs by mouth as needed (Nausea).   ondansetron 4 MG  tablet Commonly known as: ZOFRAN Take 1 tablet (4 mg total) by mouth every 4 (four) hours as needed for nausea.   oxyCODONE 5 MG immediate release tablet Commonly known as: Oxy IR/ROXICODONE 1-2 tablets po q 6 hr prn pain   prenatal multivitamin Tabs tablet Take 1 tablet by mouth daily at 12 noon.   VITA-C PO Take 3 tablets by mouth once. Gummie       Diet: routine diet  Activity: Advance as tolerated. Pelvic rest for 6 weeks.   Outpatient follow XB:LTJQ and 6 weeks Follow up Appt:No future appointments. Follow up Visit:No follow-ups on file.  Postpartum contraception: Undecided  Newborn Data: Live born female  Birth Weight: 7 lb 6.5 oz (3360 g) APGAR: 8, 9  Newborn Delivery   Birth date/time: 07/19/2019 17:09:00 Delivery type: Vaginal, Spontaneous      Baby Feeding: Bottle and Breast Disposition:home with mother   07/21/2019 Janyth Contes, MD

## 2019-07-21 NOTE — Progress Notes (Addendum)
Post Partum Day 2 Subjective: no complaints, up ad lib, voiding, tolerating PO and nl lochia, pain controlled  Objective: Blood pressure 130/85, pulse 85, temperature 98.2 F (36.8 C), temperature source Oral, resp. rate 18, height 5\' 7"  (1.702 m), weight 100.1 kg, last menstrual period 10/13/2018, SpO2 100 %, unknown if currently breastfeeding.  Physical Exam:  General: alert and no distress Lochia: appropriate Uterine Fundus: firm   Recent Labs    07/19/19 0037 07/20/19 0501  HGB 10.4* 9.3*  HCT 32.7* 30.5*    Assessment/Plan: Discharge home.  D/C with Motrin and PNV.  F/u 6 weeks Pt desires d/c with oxycodone and Zofran - d/w pt this will be limited amount can cause constipation. Will be getting OP circ, to check in at office then re mood, etc LC before d/c Pt desires ibuprofen - tolerates at home  LOS: 2 days   Erving Sassano Bovard-Stuckert 07/21/2019, 9:43 AM

## 2019-07-21 NOTE — Lactation Note (Signed)
This note was copied from a baby's chart. Lactation Consultation Note  Mom was a little anxious when we arrived but ready to learn. Assisted mom with skin to skin and advised of the importance of bringing baby to breast often. Discussed hunger cues to watch for as well.   Assisted mom with pillows for comfort. Hand expressed colostrum prior to latch. Discussed positions, mom chose cross cradle. Assisted with latch and showed mom how to help baby's lips be more flanged to avoid pinching. Baby showed audible swallows. Reminded mom to relax shoulders and gave a lot of reassurance.  Advised mom of outpatient [place] availability for an in person visit or 24/7 calls with question/concerns. Discussed the importance of pumping after feeds for 15 minutes every 3-4 hours. Mom has the double electric Lansinoh and advised to post pump until mature milk comes in.  Patient Name: Latoya Guerrero [id number] Date: 07/21/2019 Reason for consult: Follow-up assessment   Maternal Data    Feeding Feeding Type: Breast Fed  LATCH Score Latch: Grasps breast easily, tongue down, lips flanged, rhythmical sucking.  Audible Swallowing: Spontaneous and intermittent  Type of Nipple: Everted at rest and after stimulation  Comfort (Breast/Nipple): Soft / non-tender  Hold (Positioning): Assistance needed to correctly position infant at breast and maintain latch.  LATCH Score: 9  Interventions Interventions: Breast feeding basics reviewed;Assisted with latch;Skin to skin;Breast massage;Hand express;Adjust position;Support pillows;Expressed milk;DEBP  Lactation Tools Discussed/Used     Consult Status Consult Status: Complete    Darla Lesches 07/21/2019, 12:18 PM

## 2019-07-21 NOTE — Progress Notes (Addendum)
CSW attempted to meet with [name] at bedside to offer further support and assess for needs. [name] noted to have sheet pulled over face with infant asleep between her legs. [name] alert and responded when name was called. CSW asked if [name] was trying to sleep and [name] stated she was. CSW, with [name]'s permission, placed infant in bassinet and provided education about not having infant in bed with her if she was trying to sleep. CSW will attempt to meet with [name] later today.  Update: CSW able to meet with [name] and Support Person at bedside to check in following assessment the day prior. [name] seemed to be in better spirits and was welcoming of CSW's return. CSW provided [name] with list of Psychiatry and Counseling resources to follow up with after discharge. [name] appreciative of list and denied any further questions, concerns or need for resources from CSW, at this time.  Latoya Guerrero, [place]  [facility] and [employer] [phone]

## 2019-08-04 DIAGNOSIS — F3110 Bipolar disorder, current episode manic without psychotic features, unspecified: Secondary | ICD-10-CM | POA: Diagnosis not present

## 2019-08-09 DIAGNOSIS — L700 Acne vulgaris: Secondary | ICD-10-CM | POA: Diagnosis not present

## 2019-08-09 DIAGNOSIS — L739 Follicular disorder, unspecified: Secondary | ICD-10-CM | POA: Diagnosis not present

## 2019-08-19 DIAGNOSIS — K644 Residual hemorrhoidal skin tags: Secondary | ICD-10-CM | POA: Diagnosis not present

## 2019-08-19 DIAGNOSIS — L987 Excessive and redundant skin and subcutaneous tissue: Secondary | ICD-10-CM | POA: Diagnosis not present

## 2019-08-24 DIAGNOSIS — F3110 Bipolar disorder, current episode manic without psychotic features, unspecified: Secondary | ICD-10-CM | POA: Diagnosis not present

## 2019-08-30 DIAGNOSIS — Z3009 Encounter for other general counseling and advice on contraception: Secondary | ICD-10-CM | POA: Diagnosis not present

## 2019-08-30 DIAGNOSIS — Z1389 Encounter for screening for other disorder: Secondary | ICD-10-CM | POA: Diagnosis not present

## 2019-09-07 DIAGNOSIS — F3162 Bipolar disorder, current episode mixed, moderate: Secondary | ICD-10-CM | POA: Diagnosis not present

## 2019-09-07 DIAGNOSIS — F418 Other specified anxiety disorders: Secondary | ICD-10-CM | POA: Diagnosis not present

## 2019-09-07 DIAGNOSIS — B350 Tinea barbae and tinea capitis: Secondary | ICD-10-CM | POA: Diagnosis not present

## 2019-09-08 DIAGNOSIS — K648 Other hemorrhoids: Secondary | ICD-10-CM | POA: Diagnosis not present

## 2019-09-15 DIAGNOSIS — K219 Gastro-esophageal reflux disease without esophagitis: Secondary | ICD-10-CM | POA: Diagnosis not present

## 2019-10-14 DIAGNOSIS — K644 Residual hemorrhoidal skin tags: Secondary | ICD-10-CM | POA: Diagnosis not present

## 2019-11-04 DIAGNOSIS — F5104 Psychophysiologic insomnia: Secondary | ICD-10-CM | POA: Diagnosis not present

## 2019-11-04 DIAGNOSIS — B350 Tinea barbae and tinea capitis: Secondary | ICD-10-CM | POA: Diagnosis not present

## 2019-11-04 DIAGNOSIS — F3161 Bipolar disorder, current episode mixed, mild: Secondary | ICD-10-CM | POA: Diagnosis not present

## 2019-11-04 DIAGNOSIS — L700 Acne vulgaris: Secondary | ICD-10-CM | POA: Diagnosis not present

## 2019-11-09 DIAGNOSIS — Z20822 Contact with and (suspected) exposure to covid-19: Secondary | ICD-10-CM | POA: Diagnosis not present

## 2019-11-09 DIAGNOSIS — Z20828 Contact with and (suspected) exposure to other viral communicable diseases: Secondary | ICD-10-CM | POA: Diagnosis not present

## 2019-11-15 DIAGNOSIS — K648 Other hemorrhoids: Secondary | ICD-10-CM | POA: Diagnosis not present

## 2019-11-15 DIAGNOSIS — K645 Perianal venous thrombosis: Secondary | ICD-10-CM | POA: Diagnosis not present

## 2019-11-15 DIAGNOSIS — K644 Residual hemorrhoidal skin tags: Secondary | ICD-10-CM | POA: Diagnosis not present

## 2020-01-19 DIAGNOSIS — F5104 Psychophysiologic insomnia: Secondary | ICD-10-CM | POA: Diagnosis not present

## 2020-01-19 DIAGNOSIS — F3162 Bipolar disorder, current episode mixed, moderate: Secondary | ICD-10-CM | POA: Diagnosis not present

## 2020-01-19 DIAGNOSIS — F418 Other specified anxiety disorders: Secondary | ICD-10-CM | POA: Diagnosis not present

## 2020-02-07 DIAGNOSIS — F3162 Bipolar disorder, current episode mixed, moderate: Secondary | ICD-10-CM | POA: Diagnosis not present

## 2020-02-07 DIAGNOSIS — F3178 Bipolar disorder, in full remission, most recent episode mixed: Secondary | ICD-10-CM | POA: Diagnosis not present

## 2020-02-07 DIAGNOSIS — F418 Other specified anxiety disorders: Secondary | ICD-10-CM | POA: Diagnosis not present

## 2020-02-07 DIAGNOSIS — F3161 Bipolar disorder, current episode mixed, mild: Secondary | ICD-10-CM | POA: Diagnosis not present

## 2020-03-23 DIAGNOSIS — R7612 Nonspecific reaction to cell mediated immunity measurement of gamma interferon antigen response without active tuberculosis: Secondary | ICD-10-CM | POA: Diagnosis not present

## 2020-03-25 DIAGNOSIS — R102 Pelvic and perineal pain: Secondary | ICD-10-CM | POA: Diagnosis not present

## 2020-03-25 DIAGNOSIS — Z113 Encounter for screening for infections with a predominantly sexual mode of transmission: Secondary | ICD-10-CM | POA: Diagnosis not present
